# Patient Record
Sex: Female | Born: 1950 | Race: Black or African American | Hispanic: No | Smoking: Never smoker
Health system: Southern US, Community
[De-identification: ages and names within clinical notes are randomized; demographics above are authoritative.]

## PROBLEM LIST (undated history)

## (undated) ENCOUNTER — Emergency Department (HOSPITAL_COMMUNITY): Payer: 59 | Source: Home / Self Care

## (undated) DIAGNOSIS — E213 Hyperparathyroidism, unspecified: Secondary | ICD-10-CM

## (undated) DIAGNOSIS — E785 Hyperlipidemia, unspecified: Secondary | ICD-10-CM

## (undated) DIAGNOSIS — N189 Chronic kidney disease, unspecified: Secondary | ICD-10-CM

## (undated) DIAGNOSIS — I10 Essential (primary) hypertension: Secondary | ICD-10-CM

## (undated) DIAGNOSIS — D649 Anemia, unspecified: Secondary | ICD-10-CM

## (undated) DIAGNOSIS — D472 Monoclonal gammopathy: Secondary | ICD-10-CM

## (undated) HISTORY — PX: CARPAL TUNNEL RELEASE: SHX101

## (undated) HISTORY — DX: Monoclonal gammopathy: D47.2

## (undated) HISTORY — DX: Anemia, unspecified: D64.9

## (undated) HISTORY — DX: Hyperlipidemia, unspecified: E78.5

## (undated) HISTORY — DX: Hyperparathyroidism, unspecified: E21.3

## (undated) HISTORY — PX: TOTAL ABDOMINAL HYSTERECTOMY W/ BILATERAL SALPINGOOPHORECTOMY: SHX83

## (undated) HISTORY — DX: Chronic kidney disease, unspecified: N18.9

---

## 1998-07-08 ENCOUNTER — Emergency Department (HOSPITAL_COMMUNITY): Admission: EM | Admit: 1998-07-08 | Discharge: 1998-07-08 | Payer: Self-pay | Admitting: Emergency Medicine

## 1998-10-02 ENCOUNTER — Encounter: Payer: Self-pay | Admitting: *Deleted

## 1998-10-04 ENCOUNTER — Inpatient Hospital Stay (HOSPITAL_COMMUNITY): Admission: RE | Admit: 1998-10-04 | Discharge: 1998-10-06 | Payer: Self-pay | Admitting: *Deleted

## 1999-07-16 ENCOUNTER — Encounter: Admission: RE | Admit: 1999-07-16 | Discharge: 1999-10-14 | Payer: Self-pay | Admitting: Family Medicine

## 1999-12-02 ENCOUNTER — Ambulatory Visit (HOSPITAL_COMMUNITY): Admission: RE | Admit: 1999-12-02 | Discharge: 1999-12-02 | Payer: Self-pay | Admitting: Family Medicine

## 1999-12-02 ENCOUNTER — Encounter: Payer: Self-pay | Admitting: Family Medicine

## 2000-12-24 ENCOUNTER — Encounter: Admission: RE | Admit: 2000-12-24 | Discharge: 2000-12-24 | Payer: Self-pay | Admitting: Internal Medicine

## 2000-12-24 ENCOUNTER — Encounter: Payer: Self-pay | Admitting: Internal Medicine

## 2001-05-04 ENCOUNTER — Ambulatory Visit (HOSPITAL_COMMUNITY): Admission: RE | Admit: 2001-05-04 | Discharge: 2001-05-04 | Payer: Self-pay | Admitting: Internal Medicine

## 2001-05-04 ENCOUNTER — Encounter: Payer: Self-pay | Admitting: Internal Medicine

## 2003-11-02 ENCOUNTER — Ambulatory Visit (HOSPITAL_COMMUNITY): Admission: RE | Admit: 2003-11-02 | Discharge: 2003-11-02 | Payer: Self-pay | Admitting: Internal Medicine

## 2004-04-06 ENCOUNTER — Emergency Department (HOSPITAL_COMMUNITY): Admission: EM | Admit: 2004-04-06 | Discharge: 2004-04-07 | Payer: Self-pay | Admitting: Emergency Medicine

## 2004-04-08 ENCOUNTER — Emergency Department (HOSPITAL_COMMUNITY): Admission: EM | Admit: 2004-04-08 | Discharge: 2004-04-08 | Payer: Self-pay | Admitting: Emergency Medicine

## 2004-09-05 ENCOUNTER — Ambulatory Visit (HOSPITAL_BASED_OUTPATIENT_CLINIC_OR_DEPARTMENT_OTHER): Admission: RE | Admit: 2004-09-05 | Discharge: 2004-09-05 | Payer: Self-pay | Admitting: Cardiology

## 2004-09-08 ENCOUNTER — Ambulatory Visit: Payer: Self-pay | Admitting: Internal Medicine

## 2004-11-21 ENCOUNTER — Ambulatory Visit (HOSPITAL_COMMUNITY): Admission: RE | Admit: 2004-11-21 | Discharge: 2004-11-21 | Payer: Self-pay | Admitting: Cardiology

## 2005-08-25 ENCOUNTER — Encounter: Admission: RE | Admit: 2005-08-25 | Discharge: 2005-08-25 | Payer: Self-pay | Admitting: Neurosurgery

## 2011-10-15 ENCOUNTER — Other Ambulatory Visit (HOSPITAL_COMMUNITY): Payer: Self-pay | Admitting: Cardiology

## 2011-10-15 DIAGNOSIS — N189 Chronic kidney disease, unspecified: Secondary | ICD-10-CM

## 2011-10-15 DIAGNOSIS — I10 Essential (primary) hypertension: Secondary | ICD-10-CM

## 2011-10-21 ENCOUNTER — Ambulatory Visit (HOSPITAL_COMMUNITY)
Admission: RE | Admit: 2011-10-21 | Discharge: 2011-10-21 | Disposition: A | Discharge status: Home / Self Care | Payer: 59 | Source: Ambulatory Visit | Attending: Cardiology | Admitting: Cardiology

## 2011-10-21 DIAGNOSIS — N189 Chronic kidney disease, unspecified: Secondary | ICD-10-CM | POA: Insufficient documentation

## 2012-02-29 ENCOUNTER — Emergency Department (HOSPITAL_BASED_OUTPATIENT_CLINIC_OR_DEPARTMENT_OTHER)
Admission: EM | Admit: 2012-02-29 | Discharge: 2012-02-29 | Disposition: A | Discharge status: Home / Self Care | Payer: 59 | Attending: Emergency Medicine | Admitting: Emergency Medicine

## 2012-02-29 ENCOUNTER — Encounter (HOSPITAL_BASED_OUTPATIENT_CLINIC_OR_DEPARTMENT_OTHER): Payer: Self-pay | Admitting: *Deleted

## 2012-02-29 ENCOUNTER — Emergency Department (HOSPITAL_BASED_OUTPATIENT_CLINIC_OR_DEPARTMENT_OTHER): Payer: 59

## 2012-02-29 DIAGNOSIS — M778 Other enthesopathies, not elsewhere classified: Secondary | ICD-10-CM

## 2012-02-29 DIAGNOSIS — IMO0001 Reserved for inherently not codable concepts without codable children: Secondary | ICD-10-CM | POA: Insufficient documentation

## 2012-02-29 DIAGNOSIS — Z79899 Other long term (current) drug therapy: Secondary | ICD-10-CM | POA: Insufficient documentation

## 2012-02-29 DIAGNOSIS — I10 Essential (primary) hypertension: Secondary | ICD-10-CM | POA: Insufficient documentation

## 2012-02-29 DIAGNOSIS — M719 Bursopathy, unspecified: Secondary | ICD-10-CM | POA: Insufficient documentation

## 2012-02-29 DIAGNOSIS — Y929 Unspecified place or not applicable: Secondary | ICD-10-CM | POA: Insufficient documentation

## 2012-02-29 DIAGNOSIS — Y939 Activity, unspecified: Secondary | ICD-10-CM | POA: Insufficient documentation

## 2012-02-29 DIAGNOSIS — M67919 Unspecified disorder of synovium and tendon, unspecified shoulder: Secondary | ICD-10-CM | POA: Insufficient documentation

## 2012-02-29 DIAGNOSIS — X58XXXA Exposure to other specified factors, initial encounter: Secondary | ICD-10-CM | POA: Insufficient documentation

## 2012-02-29 HISTORY — DX: Essential (primary) hypertension: I10

## 2012-02-29 MED ORDER — HYDROCODONE-ACETAMINOPHEN 5-325 MG PO TABS
2.0000 | ORAL_TABLET | Freq: Once | ORAL | Status: AC
  Administered 2012-02-29: 2 via ORAL
  Filled 2012-02-29: qty 2

## 2012-02-29 MED ORDER — HYDROCODONE-ACETAMINOPHEN 5-325 MG PO TABS: 2.0000 | ORAL_TABLET | ORAL | Status: DC | PRN

## 2012-02-29 NOTE — ED Provider Notes (Signed)
History     CSN: [id number]  Arrival date & time 02/29/12  1425   First MD Initiated Contact with Patient 02/29/12 1612      Chief Complaint  Patient presents with  . Arm Pain    (Consider location/radiation/quality/duration/timing/severity/associated sxs/prior treatment) Patient is a 62 y.o. female presenting with shoulder injury. The history is provided by the patient. No language interpreter was used.  Shoulder Injury This is a new problem. The current episode started today. The problem occurs constantly. The problem has been gradually worsening. Associated symptoms include joint swelling and myalgias. Nothing aggravates the symptoms. She has tried nothing for the symptoms. The treatment provided moderate relief.   Pt complains of pain in her right shoulder. Pt did not take her blood pressure medication today Past Medical History  Diagnosis Date  . Hypertension     Past Surgical History  Procedure Laterality Date  . Cesarean section    . Carpal tunnel release    . Abdominal hysterectomy      No family history on file.  History  Substance Use Topics  . Smoking status: Never Smoker   . Smokeless tobacco: Not on file  . Alcohol Use: No    OB History   Grav Para Term Preterm Abortions TAB SAB Ect Mult Living                  Review of Systems  Musculoskeletal: Positive for myalgias and joint swelling.  All other systems reviewed and are negative.    Allergies  Review of patient's allergies indicates no known allergies.  Home Medications   Current Outpatient Rx  Name  Route  Sig  Dispense  Refill  . amLODipine (NORVASC) 10 MG tablet   Oral   Take 10 mg by mouth daily.         . nebivolol (BYSTOLIC) 10 MG tablet   Oral   Take 10 mg by mouth daily.         . valsartan (DIOVAN) 160 MG tablet   Oral   Take 160 mg by mouth daily.           BP 210/119  Pulse 67  Temp(Src) 98.2 F (36.8 C) (Oral)  Resp 16  Ht 5\' 2"  (1.575 m)  Wt 146 lb  (66.225 kg)  BMI 26.7 kg/m2  SpO2 99%  Physical Exam  Nursing note and vitals reviewed. Constitutional: She appears well-developed and well-nourished.  HENT:  Head: Normocephalic.  Right Ear: External ear normal.  Eyes: Pupils are equal, round, and reactive to light.  Neck: Normal range of motion.  Cardiovascular: Normal rate.   Pulmonary/Chest: Effort normal.  Abdominal: Soft.  Musculoskeletal: Normal range of motion.  Neurological: She is alert.  Skin: Skin is warm.  Psychiatric: She has a normal mood and affect.    ED Course  Procedures (including critical care time)  Labs Reviewed - No data to display Dg Shoulder Right  02/29/2012  *RADIOLOGY REPORT*  Clinical Data: Right shoulder pain.  No known injury.  RIGHT SHOULDER - 2+ VIEW  Comparison: None  Findings: No acute bony abnormality.  Specifically, no fracture, subluxation, or dislocation.  Soft tissues are intact. Joint spaces are maintained.  Normal bone mineralization.  Mild interstitial prominence and peribronchial thickening within the visualized right upper lung.  IMPRESSION: No acute bony abnormality.   Original Report Authenticated By: Charlett Nose, M.D.      1. Tendonitis of shoulder, right   2. Hypertension  MDM  Pt did not take her morning medication for blood pressure.   I asked her to take here.   Pt advised to continue her blood pressure medications.   She is advised to see Dr. Shana Chute for recheck this week.   Pt referred to dr. Dion Saucier for shoulder.   I suspect a rotator cuff tendonitis  Pt given hydrocodone for pain        Lonia Skinner [place], [place] 02/29/12 1821

## 2012-02-29 NOTE — ED Provider Notes (Signed)
Medical screening examination/treatment/procedure(s) were performed by non-physician practitioner and as supervising physician I was immediately available for consultation/collaboration.  Ethelda Chick, MD 02/29/12 [phone]

## 2012-02-29 NOTE — ED Notes (Addendum)
Patient states that she is having right shoulder/ arm pain. Pain been present for 2 weeks., hurts with movement and lifting. Was recently placed on norvasc and told her cardiologist that it causes her pain. Dr aware and told patient that she would need to adjust to the medication. She states that she has been trying to reach the office to have her medicine changed. Has not taken any of her medications today. Does not have any symptoms with her BP and states that it always runs high.

## 2012-04-22 ENCOUNTER — Other Ambulatory Visit (HOSPITAL_COMMUNITY): Payer: Self-pay | Admitting: Orthopedic Surgery

## 2012-04-22 DIAGNOSIS — M25511 Pain in right shoulder: Secondary | ICD-10-CM

## 2012-04-23 ENCOUNTER — Ambulatory Visit (HOSPITAL_COMMUNITY)
Admission: RE | Admit: 2012-04-23 | Discharge: 2012-04-23 | Disposition: A | Discharge status: Home / Self Care | Payer: 59 | Source: Ambulatory Visit | Attending: Orthopedic Surgery | Admitting: Orthopedic Surgery

## 2012-04-23 DIAGNOSIS — M25519 Pain in unspecified shoulder: Secondary | ICD-10-CM | POA: Insufficient documentation

## 2012-04-23 DIAGNOSIS — X58XXXA Exposure to other specified factors, initial encounter: Secondary | ICD-10-CM | POA: Insufficient documentation

## 2012-04-23 DIAGNOSIS — IMO0002 Reserved for concepts with insufficient information to code with codable children: Secondary | ICD-10-CM | POA: Insufficient documentation

## 2012-04-23 DIAGNOSIS — M25419 Effusion, unspecified shoulder: Secondary | ICD-10-CM | POA: Insufficient documentation

## 2012-04-23 DIAGNOSIS — M6789 Other specified disorders of synovium and tendon, multiple sites: Secondary | ICD-10-CM | POA: Insufficient documentation

## 2012-04-23 DIAGNOSIS — M25511 Pain in right shoulder: Secondary | ICD-10-CM

## 2012-04-23 DIAGNOSIS — M75100 Unspecified rotator cuff tear or rupture of unspecified shoulder, not specified as traumatic: Secondary | ICD-10-CM | POA: Insufficient documentation

## 2012-05-21 ENCOUNTER — Ambulatory Visit: Payer: 59 | Admitting: Physical Therapy

## 2013-01-18 ENCOUNTER — Emergency Department (HOSPITAL_BASED_OUTPATIENT_CLINIC_OR_DEPARTMENT_OTHER)
Admission: EM | Admit: 2013-01-18 | Discharge: 2013-01-19 | Disposition: A | Discharge status: Home / Self Care | Payer: 59 | Attending: Emergency Medicine | Admitting: Emergency Medicine

## 2013-01-18 ENCOUNTER — Encounter (HOSPITAL_BASED_OUTPATIENT_CLINIC_OR_DEPARTMENT_OTHER): Payer: Self-pay | Admitting: Emergency Medicine

## 2013-01-18 DIAGNOSIS — I10 Essential (primary) hypertension: Secondary | ICD-10-CM | POA: Insufficient documentation

## 2013-01-18 DIAGNOSIS — R209 Unspecified disturbances of skin sensation: Secondary | ICD-10-CM | POA: Insufficient documentation

## 2013-01-18 DIAGNOSIS — Z79899 Other long term (current) drug therapy: Secondary | ICD-10-CM | POA: Insufficient documentation

## 2013-01-18 DIAGNOSIS — S79919A Unspecified injury of unspecified hip, initial encounter: Secondary | ICD-10-CM | POA: Insufficient documentation

## 2013-01-18 DIAGNOSIS — T07XXXA Unspecified multiple injuries, initial encounter: Secondary | ICD-10-CM | POA: Insufficient documentation

## 2013-01-18 DIAGNOSIS — S79929A Unspecified injury of unspecified thigh, initial encounter: Secondary | ICD-10-CM

## 2013-01-18 DIAGNOSIS — Y9289 Other specified places as the place of occurrence of the external cause: Secondary | ICD-10-CM | POA: Insufficient documentation

## 2013-01-18 DIAGNOSIS — I160 Hypertensive urgency: Secondary | ICD-10-CM

## 2013-01-18 DIAGNOSIS — W1789XA Other fall from one level to another, initial encounter: Secondary | ICD-10-CM | POA: Insufficient documentation

## 2013-01-18 DIAGNOSIS — Y9389 Activity, other specified: Secondary | ICD-10-CM | POA: Insufficient documentation

## 2013-01-18 DIAGNOSIS — S8990XA Unspecified injury of unspecified lower leg, initial encounter: Secondary | ICD-10-CM | POA: Insufficient documentation

## 2013-01-18 DIAGNOSIS — W19XXXA Unspecified fall, initial encounter: Secondary | ICD-10-CM

## 2013-01-18 DIAGNOSIS — S99929A Unspecified injury of unspecified foot, initial encounter: Secondary | ICD-10-CM

## 2013-01-18 DIAGNOSIS — R259 Unspecified abnormal involuntary movements: Secondary | ICD-10-CM | POA: Insufficient documentation

## 2013-01-18 DIAGNOSIS — S99919A Unspecified injury of unspecified ankle, initial encounter: Secondary | ICD-10-CM
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
## 2013-01-18 NOTE — ED Notes (Signed)
BP noted. Pt states she did not take her BP med today.

## 2013-01-19 LAB — CBC WITH DIFFERENTIAL/PLATELET
BASOS ABS: 0 10*3/uL (ref 0.0–0.1)
BASOS PCT: 1 % (ref 0–1)
Eosinophils Absolute: 0.3 10*3/uL (ref 0.0–0.7)
Eosinophils Relative: 3 % (ref 0–5)
HCT: 32.5 % — ABNORMAL LOW (ref 36.0–46.0)
Hemoglobin: 10.8 g/dL — ABNORMAL LOW (ref 12.0–15.0)
LYMPHS PCT: 18 % (ref 12–46)
Lymphs Abs: 1.5 10*3/uL (ref 0.7–4.0)
MCH: 26.5 pg (ref 26.0–34.0)
MCHC: 33.2 g/dL (ref 30.0–36.0)
MCV: 79.7 fL (ref 78.0–100.0)
MONO ABS: 0.7 10*3/uL (ref 0.1–1.0)
Monocytes Relative: 8 % (ref 3–12)
NEUTROS ABS: 6 10*3/uL (ref 1.7–7.7)
NEUTROS PCT: 71 % (ref 43–77)
PLATELETS: 202 10*3/uL (ref 150–400)
RBC: 4.08 MIL/uL (ref 3.87–5.11)
RDW: 15.1 % (ref 11.5–15.5)
WBC: 8.5 10*3/uL (ref 4.0–10.5)

## 2013-01-19 LAB — COMPREHENSIVE METABOLIC PANEL
ALBUMIN: 4.5 g/dL (ref 3.5–5.2)
ALT: 19 U/L (ref 0–35)
AST: 20 U/L (ref 0–37)
Alkaline Phosphatase: 145 U/L — ABNORMAL HIGH (ref 39–117)
BILIRUBIN TOTAL: 0.4 mg/dL (ref 0.3–1.2)
BUN: 36 mg/dL — ABNORMAL HIGH (ref 6–23)
CALCIUM: 9.5 mg/dL (ref 8.4–10.5)
CHLORIDE: 102 meq/L (ref 96–112)
CO2: 27 meq/L (ref 19–32)
CREATININE: 2.3 mg/dL — AB (ref 0.50–1.10)
GFR calc Af Amer: 25 mL/min — ABNORMAL LOW (ref >90)
GFR, EST NON AFRICAN AMERICAN: 22 mL/min — AB (ref >90)
Glucose, Bld: 106 mg/dL — ABNORMAL HIGH (ref 70–99)
Potassium: 3.4 mEq/L — ABNORMAL LOW (ref 3.7–5.3)
SODIUM: 143 meq/L (ref 137–147)
Total Protein: 8.2 g/dL (ref 6.0–8.3)

## 2013-01-19 MED ORDER — CLONIDINE HCL 0.1 MG PO TABS
0.2000 mg | ORAL_TABLET | Freq: Once | ORAL | Status: AC
  Administered 2013-01-19: 0.2 mg via ORAL
  Filled 2013-01-19: qty 2

## 2013-01-19 MED ORDER — NEBIVOLOL HCL 10 MG PO TABS: 10.0000 mg | ORAL_TABLET | Freq: Every day | ORAL | Status: DC

## 2013-01-19 NOTE — Discharge Instructions (Signed)
By systolic 10 mg daily. Keep a record of your blood pressures at home so that you may present these to your primary Dr. for evaluation at your next followup.  Return to the emergency department if you develop chest pain, severe headache, difficulty breathing, or other new or concerning symptoms.   Arterial Hypertension Arterial hypertension (high blood pressure) is a condition of elevated pressure in your blood vessels. Hypertension over a long period of time is a risk factor for strokes, heart attacks, and heart failure. It is also the leading cause of kidney (renal) failure.  CAUSES   In Adults -- Over 90% of all hypertension has no known cause. This is called essential or primary hypertension. In the other 10% of people with hypertension, the increase in blood pressure is caused by another disorder. This is called secondary hypertension. Important causes of secondary hypertension are:  Heavy alcohol use.  Obstructive sleep apnea.  Hyperaldosterosim (Conn's syndrome).  Steroid use.  Chronic kidney failure.  Hyperparathyroidism.  Medications.  Renal artery stenosis.  Pheochromocytoma.  Cushing's disease.  Coarctation of the aorta.  Scleroderma renal crisis.  Licorice (in excessive amounts).  Drugs (cocaine, methamphetamine). Your caregiver can explain any items above that apply to you.  In Children -- Secondary hypertension is more common and should always be considered.  Pregnancy -- Few women of childbearing age have high blood pressure. However, up to 10% of them develop hypertension of pregnancy. Generally, this will not harm the woman. It may be a sign of 3 complications of pregnancy: preeclampsia, HELLP syndrome, and eclampsia. Follow up and control with medication is necessary. SYMPTOMS   This condition normally does not produce any noticeable symptoms. It is usually found during a routine exam.  Malignant hypertension is a late problem of high blood pressure.  It may have the following symptoms:  Headaches.  Blurred vision.  End-organ damage (this means your kidneys, heart, lungs, and other organs are being damaged).  Stressful situations can increase the blood pressure. If a person with normal blood pressure has their blood pressure go up while being seen by their caregiver, this is often termed "white coat hypertension." Its importance is not known. It may be related with eventually developing hypertension or complications of hypertension.  Hypertension is often confused with mental tension, stress, and anxiety. DIAGNOSIS  The diagnosis is made by 3 separate blood pressure measurements. They are taken at least 1 week apart from each other. If there is organ damage from hypertension, the diagnosis may be made without repeat measurements. Hypertension is usually identified by having blood pressure readings:  Above 140/90 mmHg measured in both arms, at 3 separate times, over a couple weeks.  Over 130/80 mmHg should be considered a risk factor and may require treatment in patients with diabetes. Blood pressure readings over 120/80 mmHg are called "pre-hypertension" even in non-diabetic patients. To get a true blood pressure measurement, use the following guidelines. Be aware of the factors that can alter blood pressure readings.  Take measurements at least 1 hour after caffeine.  Take measurements 30 minutes after smoking and without any stress. This is another reason to quit smoking  it raises your blood pressure.  Use a proper cuff size. Ask your caregiver if you are not sure about your cuff size.  Most home blood pressure cuffs are automatic. They will measure systolic and diastolic pressures. The systolic pressure is the pressure reading at the start of sounds. Diastolic pressure is the pressure at which the sounds  disappear. If you are elderly, measure pressures in multiple postures. Try sitting, lying or standing.  Sit at rest for a  minimum of 5 minutes before taking measurements.  You should not be on any medications like decongestants. These are found in many cold medications.  Record your blood pressure readings and review them with your caregiver. If you have hypertension:  Your caregiver may do tests to be sure you do not have secondary hypertension (see "causes" above).  Your caregiver may also look for signs of metabolic syndrome. This is also called Syndrome X or Insulin Resistance Syndrome. You may have this syndrome if you have type 2 diabetes, abdominal obesity, and abnormal blood lipids in addition to hypertension.  Your caregiver will take your medical and family history and perform a physical exam.  Diagnostic tests may include blood tests (for glucose, cholesterol, potassium, and kidney function), a urinalysis, or an EKG. Other tests may also be necessary depending on your condition. PREVENTION  There are important lifestyle issues that you can adopt to reduce your chance of developing hypertension:  Maintain a normal weight.  Limit the amount of salt (sodium) in your diet.  Exercise often.  Limit alcohol intake.  Get enough potassium in your diet. Discuss specific advice with your caregiver.  Follow a DASH diet (dietary approaches to stop hypertension). This diet is rich in fruits, vegetables, and low-fat dairy products, and avoids certain fats. PROGNOSIS  Essential hypertension cannot be cured. Lifestyle changes and medical treatment can lower blood pressure and reduce complications. The prognosis of secondary hypertension depends on the underlying cause. Many people whose hypertension is controlled with medicine or lifestyle changes can live a normal, healthy life.  RISKS AND COMPLICATIONS  While high blood pressure alone is not an illness, it often requires treatment due to its short- and long-term effects on many organs. Hypertension increases your risk for:  CVAs or strokes (cerebrovascular  accident).  Heart failure due to chronically high blood pressure (hypertensive cardiomyopathy).  Heart attack (myocardial infarction).  Damage to the retina (hypertensive retinopathy).  Kidney failure (hypertensive nephropathy). Your caregiver can explain list items above that apply to you. Treatment of hypertension can significantly reduce the risk of complications. TREATMENT   For overweight patients, weight loss and regular exercise are recommended. Physical fitness lowers blood pressure.  Mild hypertension is usually treated with diet and exercise. A diet rich in fruits and vegetables, fat-free dairy products, and foods low in fat and salt (sodium) can help lower blood pressure. Decreasing salt intake decreases blood pressure in a 1/3 of people.  Stop smoking if you are a smoker. The steps above are highly effective in reducing blood pressure. While these actions are easy to suggest, they are difficult to achieve. Most patients with moderate or severe hypertension end up requiring medications to bring their blood pressure down to a normal level. There are several classes of medications for treatment. Blood pressure pills (antihypertensives) will lower blood pressure by their different actions. Lowering the blood pressure by 10 mmHg may decrease the risk of complications by as much as 25%. The goal of treatment is effective blood pressure control. This will reduce your risk for complications. Your caregiver will help you determine the best treatment for you according to your lifestyle. What is excellent treatment for one person, may not be for you. HOME CARE INSTRUCTIONS   Do not smoke.  Follow the lifestyle changes outlined in the "Prevention" section.  If you are on medications, follow the directions  carefully. Blood pressure medications must be taken as prescribed. Skipping doses reduces their benefit. It also puts you at risk for problems.  Follow up with your caregiver, as  directed.  If you are asked to monitor your blood pressure at home, follow the guidelines in the "Diagnosis" section above. SEEK MEDICAL CARE IF:   You think you are having medication side effects.  You have recurrent headaches or lightheadedness.  You have swelling in your ankles.  You have trouble with your vision. SEEK IMMEDIATE MEDICAL CARE IF:   You have sudden onset of chest pain or pressure, difficulty breathing, or other symptoms of a heart attack.  You have a severe headache.  You have symptoms of a stroke (such as sudden weakness, difficulty speaking, difficulty walking). MAKE SURE YOU:   Understand these instructions.  Will watch your condition.  Will get help right away if you are not doing well or get worse. Document Released: 12/30/2004 Document Revised: 03/24/2011 Document Reviewed: 07/30/2006 [facility] Patient Information 2014 [place].
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
## 2013-02-16 ENCOUNTER — Encounter: Payer: Self-pay | Admitting: Physician Assistant

## 2013-02-16 ENCOUNTER — Ambulatory Visit (INDEPENDENT_AMBULATORY_CARE_PROVIDER_SITE_OTHER): Payer: 59 | Admitting: Physician Assistant

## 2013-02-16 ENCOUNTER — Other Ambulatory Visit (INDEPENDENT_AMBULATORY_CARE_PROVIDER_SITE_OTHER): Payer: 59

## 2013-02-16 VITALS — BP 168/98 | HR 78 | Temp 97.8°F | Resp 16 | Ht 62.0 in | Wt 148.2 lb

## 2013-02-16 DIAGNOSIS — R739 Hyperglycemia, unspecified: Secondary | ICD-10-CM

## 2013-02-16 DIAGNOSIS — Z1231 Encounter for screening mammogram for malignant neoplasm of breast: Secondary | ICD-10-CM

## 2013-02-16 DIAGNOSIS — I517 Cardiomegaly: Secondary | ICD-10-CM

## 2013-02-16 DIAGNOSIS — R0602 Shortness of breath: Secondary | ICD-10-CM

## 2013-02-16 DIAGNOSIS — I10 Essential (primary) hypertension: Secondary | ICD-10-CM

## 2013-02-16 DIAGNOSIS — R7309 Other abnormal glucose: Secondary | ICD-10-CM

## 2013-02-16 DIAGNOSIS — Z0000 Encounter for general adult medical examination without abnormal findings: Secondary | ICD-10-CM

## 2013-02-16 LAB — URINALYSIS, ROUTINE W REFLEX MICROSCOPIC
BILIRUBIN URINE: NEGATIVE
HGB URINE DIPSTICK: NEGATIVE
Ketones, ur: NEGATIVE
LEUKOCYTES UA: NEGATIVE
NITRITE: NEGATIVE
RBC / HPF: NONE SEEN (ref 0–?)
Specific Gravity, Urine: 1.02 (ref 1.000–1.030)
Total Protein, Urine: 30 — AB
Urine Glucose: NEGATIVE
Urobilinogen, UA: 0.2 (ref 0.0–1.0)
pH: 7 (ref 5.0–8.0)

## 2013-02-16 LAB — LIPID PANEL
CHOL/HDL RATIO: 5
Cholesterol: 189 mg/dL (ref 0–200)
HDL: 40.8 mg/dL (ref >39.00)
LDL Cholesterol: 127 mg/dL — ABNORMAL HIGH (ref 0–99)
Triglycerides: 104 mg/dL (ref 0.0–149.0)
VLDL: 20.8 mg/dL (ref 0.0–40.0)

## 2013-02-16 LAB — HEPATIC FUNCTION PANEL
ALK PHOS: 124 U/L — AB (ref 39–117)
ALT: 28 U/L (ref 0–35)
AST: 22 U/L (ref 0–37)
Albumin: 4.2 g/dL (ref 3.5–5.2)
BILIRUBIN DIRECT: 0 mg/dL (ref 0.0–0.3)
TOTAL PROTEIN: 7.6 g/dL (ref 6.0–8.3)
Total Bilirubin: 0.8 mg/dL (ref 0.3–1.2)

## 2013-02-16 LAB — CBC WITH DIFFERENTIAL/PLATELET
BASOS ABS: 0.1 10*3/uL (ref 0.0–0.1)
BASOS PCT: 0.8 % (ref 0.0–3.0)
EOS ABS: 0.3 10*3/uL (ref 0.0–0.7)
Eosinophils Relative: 4.9 % (ref 0.0–5.0)
HCT: 35.3 % — ABNORMAL LOW (ref 36.0–46.0)
HEMOGLOBIN: 11.4 g/dL — AB (ref 12.0–15.0)
Lymphocytes Relative: 21.5 % (ref 12.0–46.0)
Lymphs Abs: 1.4 10*3/uL (ref 0.7–4.0)
MCHC: 32.4 g/dL (ref 30.0–36.0)
MCV: 81.8 fl (ref 78.0–100.0)
MONO ABS: 0.4 10*3/uL (ref 0.1–1.0)
Monocytes Relative: 6.6 % (ref 3.0–12.0)
NEUTROS ABS: 4.3 10*3/uL (ref 1.4–7.7)
Neutrophils Relative %: 66.2 % (ref 43.0–77.0)
Platelets: 262 10*3/uL (ref 150.0–400.0)
RBC: 4.32 Mil/uL (ref 3.87–5.11)
RDW: 15.7 % — AB (ref 11.5–14.6)
WBC: 6.5 10*3/uL (ref 4.5–10.5)

## 2013-02-16 LAB — BASIC METABOLIC PANEL
BUN: 29 mg/dL — AB (ref 6–23)
CO2: 28 mEq/L (ref 19–32)
CREATININE: 2.2 mg/dL — AB (ref 0.4–1.2)
Calcium: 9 mg/dL (ref 8.4–10.5)
Chloride: 103 mEq/L (ref 96–112)
GFR: 28.58 mL/min — AB (ref >60.00)
Glucose, Bld: 86 mg/dL (ref 70–99)
POTASSIUM: 3.8 meq/L (ref 3.5–5.1)
Sodium: 139 mEq/L (ref 135–145)

## 2013-02-16 LAB — HEMOGLOBIN A1C: Hgb A1c MFr Bld: 5 % (ref 4.6–6.5)

## 2013-02-16 LAB — BRAIN NATRIURETIC PEPTIDE: Pro B Natriuretic peptide (BNP): 745 pg/mL — ABNORMAL HIGH (ref 0.0–100.0)

## 2013-02-16 NOTE — Progress Notes (Signed)
Subjective:    Patient ID: Briana Brock, female    DOB: 1950-08-18, 63 y.o.   MRN: [id number]  HPI Comments: Patient is a 63 year old female who presents to the clinic to establish care. States no history of chronic medical conditions until diagnosed with hypertension this past January. Reports was seen in urgent care and diagnosed with hypertension, placed on Bystolic. Reports she has taken it daily as directed. States she has had a coworker monitor her blood pressure which has been high ever since. Unable to provide specific value. Patient reports concern for her elevated blood pressure stating her father died suddenly with heart problems however, was never diagnosed with any chronic disease. Reports in the last week she has noted a new shortness of breath. States it is not constant and not at rest. Denies chest pain/palpitations, visual change/disturbances, extremity swelling, change in bowel/bladder habits, dizzyness/lightheaded, or other concerns.    Review of Systems  Constitutional: Negative for fever, chills, appetite change and fatigue.  Eyes: Negative for pain and visual disturbance.  Respiratory: Positive for shortness of breath (within last week). Negative for cough, chest tightness and wheezing.   Cardiovascular: Negative for chest pain, palpitations and leg swelling.  Gastrointestinal: Negative for nausea, vomiting and abdominal pain.  Neurological: Positive for numbness (in left fingertips, relieved with movement of hand). Negative for dizziness, weakness, light-headedness and headaches.  All other systems reviewed and are negative.   Past Medical History  Diagnosis Date  . Hypertension    History reviewed. No pertinent family history. History   Social History  . Marital Status: Divorced    Spouse Name: N/A    Number of Children: N/A  . Years of Education: N/A   Social History Main Topics  . Smoking status: Never Smoker   . Smokeless tobacco: None  . Alcohol Use:  No  . Drug Use: None  . Sexual Activity: None   Other Topics Concern  . None   Social History Narrative  . None   Past Surgical History  Procedure Laterality Date  . Cesarean section    . Carpal tunnel release    . Abdominal hysterectomy         Objective:   Physical Exam  Vitals reviewed. Constitutional: She is oriented to person, place, and time. She appears well-developed and well-nourished. No distress.  HENT:  Head: Normocephalic and atraumatic.  Right Ear: Hearing, tympanic membrane, external ear and ear canal normal.  Left Ear: Hearing, tympanic membrane, external ear and ear canal normal.  Nose: Nose normal.  Mouth/Throat: Uvula is midline, oropharynx is clear and moist and mucous membranes are normal.  Eyes: Conjunctivae and EOM are normal. Pupils are equal, round, and reactive to light. No scleral icterus.  Neck: Trachea normal and normal range of motion. Carotid bruit is not present.  Cardiovascular: Normal rate, regular rhythm, S1 normal, S2 normal and normal heart sounds.  Exam reveals no gallop and no friction rub.   No murmur heard. Pulses:      Radial pulses are 2+ on the right side, and 2+ on the left side.       Posterior tibial pulses are 2+ on the right side, and 2+ on the left side.  Pulmonary/Chest: Effort normal and breath sounds normal. She has no wheezes. She has no rhonchi. She has no rales.  Abdominal: Soft. Normal appearance and bowel sounds are normal. She exhibits no distension. There is no tenderness.  Genitourinary:  Deferred to GYN  Musculoskeletal: Normal range  of motion.  FROM U/LE bilateral  Lymphadenopathy:    She has no cervical adenopathy.       Right: No supraclavicular adenopathy present.       Left: No supraclavicular adenopathy present.  Neurological: She is alert and oriented to person, place, and time. She has normal strength. She displays a negative Romberg sign.  Skin: Skin is warm and dry. She is not diaphoretic.    Psychiatric: She has a normal mood and affect. Her speech is normal and behavior is normal.    ECG: today Sinus rhythm, left atrial enlargement Rate: 63     Assessment & Plan:    CPX/v70.0 - Patient has been counseled on age-appropriate routine health concerns for screening and prevention. These are reviewed and up-to-date. Immunizations are up-to-date or declined. Labs ordered and will be reviewed, ECG reviewed.  HTN: Elevated reading while in office today, second reading was 168/120, EKG in office, interpretation in note. Will check cardiac panel, troponin, bnp. Also short of breath will get chest xray. Patient bp elevated, instructed patient to continue with Bystolic as directed and will add on Benicar HCT 40 mg /12.5 mg one tab daily. Samples provided total 42 doses. patient instructed to return to office in one month for evaluation of bp. Instructed to monitor and log bp at home.  Hyperglycemia: Glucose elevated on previous lab will get HgA1C to evaluate for DM.   Patient instructed to go to ED if symptoms worsen.

## 2013-02-16 NOTE — Patient Instructions (Signed)
It was great meeting you today Ms. Briana Brock! Labs have been ordered for you, when you report to lab please be fasting.     Hypertension Hypertension is another name for high blood pressure. High blood pressure may mean that your heart needs to work harder to pump blood. Blood pressure consists of two numbers, which includes a higher number over a lower number (example: 110/72). HOME CARE   Make lifestyle changes as told by your doctor. This may include weight loss and exercise.  Take your blood pressure medicine every day.  Limit how much salt you use.  Stop smoking if you smoke.  Do not use drugs.  Talk to your doctor if you are using decongestants or birth control pills. These medicines might make blood pressure higher.  Females should not drink more than 1 alcoholic drink per day. Males should not drink more than 2 alcoholic drinks per day.  See your doctor as told. GET HELP RIGHT AWAY IF:   You have a blood pressure reading with a top number of 180 or higher.  You get a very bad headache.  You get blurred or changing vision.  You feel confused.  You feel weak, numb, or faint.  You get chest or belly (abdominal) pain.  You throw up (vomit).  You cannot breathe very well. MAKE SURE YOU:   Understand these instructions.  Will watch your condition.  Will get help right away if you are not doing well or get worse. Document Released: 06/18/2007 Document Revised: 03/24/2011 Document Reviewed: 06/18/2007 [facility] Patient Information 2014 [place], [place].  Shortness of Breath Shortness of breath means you have trouble breathing. Shortness of breath needs medical care right away. HOME CARE   Do not smoke.  Avoid being around chemicals or things (paint fumes, dust) that may bother your breathing.  Rest as needed. Slowly begin your normal activities.  Only take medicines as told by your doctor.  Keep all doctor visits as told. GET HELP RIGHT AWAY IF:   Your  shortness of breath gets worse.  You feel lightheaded, pass out (faint), or have a cough that is not helped by medicine.  You cough up blood.  You have pain with breathing.  You have pain in your chest, arms, shoulders, or belly (abdomen).  You have a fever.  You cannot walk up stairs or exercise the way you normally do.  You do not get better in the time expected.  You have a hard time doing normal activities even with rest.  You have problems with your medicines.  You have any new symptoms. MAKE SURE YOU:  Understand these instructions.  Will watch your condition.  Will get help right away if you are not doing well or get worse. Document Released: 06/18/2007 Document Revised: 07/01/2011 Document Reviewed: 03/17/2011 [facility] Patient Information 2014 [place], [place].    Health Maintenance, Female A healthy lifestyle and preventative care can promote health and wellness.  Maintain regular health, dental, and eye exams.  Eat a healthy diet. Foods like vegetables, fruits, whole grains, low-fat dairy products, and lean protein foods contain the nutrients you need without too many calories. Decrease your intake of foods high in solid fats, added sugars, and salt. Get information about a proper diet from your caregiver, if necessary.  Regular physical exercise is one of the most important things you can do for your health. Most adults should get at least 150 minutes of moderate-intensity exercise (any activity that increases your heart rate and causes you  to sweat) each week. In addition, most adults need muscle-strengthening exercises on 2 or more days a week.   Maintain a healthy weight. The body mass index (BMI) is a screening tool to identify possible weight problems. It provides an estimate of body fat based on height and weight. Your caregiver can help determine your BMI, and can help you achieve or maintain a healthy weight. For adults 20 years and older:  A BMI below  18.5 is considered underweight.  A BMI of 18.5 to 24.9 is normal.  A BMI of 25 to 29.9 is considered overweight.  A BMI of 30 and above is considered obese.  Maintain normal blood lipids and cholesterol by exercising and minimizing your intake of saturated fat. Eat a balanced diet with plenty of fruits and vegetables. Blood tests for lipids and cholesterol should begin at age 51 and be repeated every 5 years. If your lipid or cholesterol levels are high, you are over 50, or you are a high risk for heart disease, you may need your cholesterol levels checked more frequently.Ongoing high lipid and cholesterol levels should be treated with medicines if diet and exercise are not effective.  If you smoke, find out from your caregiver how to quit. If you do not use tobacco, do not start.  Lung cancer screening is recommended for adults aged 84 80 years who are at high risk for developing lung cancer because of a history of smoking. Yearly low-dose computed tomography (CT) is recommended for people who have at least a 30-pack-year history of smoking and are a current smoker or have quit within the past 15 years. A pack year of smoking is smoking an average of 1 pack of cigarettes a day for 1 year (for example: 1 pack a day for 30 years or 2 packs a day for 15 years). Yearly screening should continue until the smoker has stopped smoking for at least 15 years. Yearly screening should also be stopped for people who develop a health problem that would prevent them from having lung cancer treatment.  If you are pregnant, do not drink alcohol. If you are breastfeeding, be very cautious about drinking alcohol. If you are not pregnant and choose to drink alcohol, do not exceed 1 drink per day. One drink is considered to be 12 ounces (355 mL) of beer, 5 ounces (148 mL) of wine, or 1.5 ounces (44 mL) of liquor.  Avoid use of street drugs. Do not share needles with anyone. Ask for help if you need support or  instructions about stopping the use of drugs.  High blood pressure causes heart disease and increases the risk of stroke. Blood pressure should be checked at least every 1 to 2 years. Ongoing high blood pressure should be treated with medicines, if weight loss and exercise are not effective.  If you are 33 to 61 years old, ask your caregiver if you should take aspirin to prevent strokes.  Diabetes screening involves taking a blood sample to check your fasting blood sugar level. This should be done once every 3 years, after age 36, if you are within normal weight and without risk factors for diabetes. Testing should be considered at a younger age or be carried out more frequently if you are overweight and have at least 1 risk factor for diabetes.  Breast cancer screening is essential preventative care for women. You should practice "breast self-awareness." This means understanding the normal appearance and feel of your breasts and may include breast self-examination.  Any changes detected, no matter how small, should be reported to a caregiver. Women in their 39s and 30s should have a clinical breast exam (CBE) by a caregiver as part of a regular health exam every 1 to 3 years. After age 65, women should have a CBE every year. Starting at age 46, women should consider having a mammogram (breast X-ray) every year. Women who have a family history of breast cancer should talk to their caregiver about genetic screening. Women at a high risk of breast cancer should talk to their caregiver about having an MRI and a mammogram every year.  Breast cancer gene (BRCA)-related cancer risk assessment is recommended for women who have family members with BRCA-related cancers. BRCA-related cancers include breast, ovarian, tubal, and peritoneal cancers. Having family members with these cancers may be associated with an increased risk for harmful changes (mutations) in the breast cancer genes BRCA1 and BRCA2. Results of the  assessment will determine the need for genetic counseling and BRCA1 and BRCA2 testing.  The Pap test is a screening test for cervical cancer. Women should have a Pap test starting at age 10. Between ages 43 and 82, Pap tests should be repeated every 2 years. Beginning at age 8, you should have a Pap test every 3 years as long as the past 3 Pap tests have been normal. If you had a hysterectomy for a problem that was not cancer or a condition that could lead to cancer, then you no longer need Pap tests. If you are between ages 54 and 88, and you have had normal Pap tests going back 10 years, you no longer need Pap tests. If you have had past treatment for cervical cancer or a condition that could lead to cancer, you need Pap tests and screening for cancer for at least 20 years after your treatment. If Pap tests have been discontinued, risk factors (such as a new sexual partner) need to be reassessed to determine if screening should be resumed. Some women have medical problems that increase the chance of getting cervical cancer. In these cases, your caregiver may recommend more frequent screening and Pap tests.  The human papillomavirus (HPV) test is an additional test that may be used for cervical cancer screening. The HPV test looks for the virus that can cause the cell changes on the cervix. The cells collected during the Pap test can be tested for HPV. The HPV test could be used to screen women aged 70 years and older, and should be used in women of any age who have unclear Pap test results. After the age of 58, women should have HPV testing at the same frequency as a Pap test.  Colorectal cancer can be detected and often prevented. Most routine colorectal cancer screening begins at the age of 12 and continues through age 74. However, your caregiver may recommend screening at an earlier age if you have risk factors for colon cancer. On a yearly basis, your caregiver may provide home test kits to check for  hidden blood in the stool. Use of a small camera at the end of a tube, to directly examine the colon (sigmoidoscopy or colonoscopy), can detect the earliest forms of colorectal cancer. Talk to your caregiver about this at age 70, when routine screening begins. Direct examination of the colon should be repeated every 5 to 10 years through age 3, unless early forms of pre-cancerous polyps or small growths are found.  Hepatitis C blood testing is recommended for all  people born from [birth year] through 1963 and any individual with known risks for hepatitis C.  Practice safe sex. Use condoms and avoid high-risk sexual practices to reduce the spread of sexually transmitted infections (STIs). Sexually active women aged 71 and younger should be checked for Chlamydia, which is a common sexually transmitted infection. Older women with new or multiple partners should also be tested for Chlamydia. Testing for other STIs is recommended if you are sexually active and at increased risk.  Osteoporosis is a disease in which the bones lose minerals and strength with aging. This can result in serious bone fractures. The risk of osteoporosis can be identified using a bone density scan. Women ages 26 and over and women at risk for fractures or osteoporosis should discuss screening with their caregivers. Ask your caregiver whether you should be taking a calcium supplement or vitamin D to reduce the rate of osteoporosis.  Menopause can be associated with physical symptoms and risks. Hormone replacement therapy is available to decrease symptoms and risks. You should talk to your caregiver about whether hormone replacement therapy is right for you.  Use sunscreen. Apply sunscreen liberally and repeatedly throughout the day. You should seek shade when your shadow is shorter than you. Protect yourself by wearing long sleeves, pants, a wide-brimmed hat, and sunglasses year round, whenever you are outdoors.  Notify your caregiver of new  moles or changes in moles, especially if there is a change in shape or color. Also notify your caregiver if a mole is larger than the size of a pencil eraser.  Stay current with your immunizations. Document Released: 07/15/2010 Document Revised: 04/26/2012 Document Reviewed: 07/15/2010 [facility] Patient Information 2014 [place].

## 2013-02-17 ENCOUNTER — Encounter: Payer: Self-pay | Admitting: Physician Assistant

## 2013-02-17 LAB — CARDIAC PANEL
CK MB: 1.7 ng/mL (ref 0.3–4.0)
RELATIVE INDEX: 1.9 calc (ref 0.0–2.5)
Total CK: 89 U/L (ref 7–177)

## 2013-02-17 LAB — TROPONIN I: Troponin I: 0.02 ng/mL (ref <0.06)

## 2013-02-17 LAB — TSH: TSH: 1.97 u[IU]/mL (ref 0.35–5.50)

## 2013-03-04 ENCOUNTER — Ambulatory Visit (INDEPENDENT_AMBULATORY_CARE_PROVIDER_SITE_OTHER): Payer: 59 | Admitting: Cardiology

## 2013-03-04 ENCOUNTER — Encounter: Payer: Self-pay | Admitting: Cardiology

## 2013-03-04 VITALS — BP 206/148 | HR 82 | Ht 62.0 in | Wt 148.0 lb

## 2013-03-04 DIAGNOSIS — R9431 Abnormal electrocardiogram [ECG] [EKG]: Secondary | ICD-10-CM | POA: Insufficient documentation

## 2013-03-04 DIAGNOSIS — R0609 Other forms of dyspnea: Secondary | ICD-10-CM

## 2013-03-04 DIAGNOSIS — I10 Essential (primary) hypertension: Secondary | ICD-10-CM

## 2013-03-04 DIAGNOSIS — R0989 Other specified symptoms and signs involving the circulatory and respiratory systems: Secondary | ICD-10-CM

## 2013-03-04 DIAGNOSIS — R0600 Dyspnea, unspecified: Secondary | ICD-10-CM | POA: Insufficient documentation

## 2013-03-04 MED ORDER — AMLODIPINE BESYLATE 5 MG PO TABS: 5.0000 mg | ORAL_TABLET | Freq: Every day | ORAL | Status: DC

## 2013-03-04 MED ORDER — NEBIVOLOL HCL 10 MG PO TABS: 20.0000 mg | ORAL_TABLET | Freq: Every day | ORAL | Status: DC

## 2013-03-04 NOTE — Patient Instructions (Signed)
Your physician has recommended you make the following change in your medication:   1. Start Amlodipine 5 mg once daily. 2. Increase Bystolic to 20 mg once daily  Your physician has requested that you have an echocardiogram. Echocardiography is a painless test that uses sound waves to create images of your heart. It provides your doctor with information about the size and shape of your heart and how well your heart's chambers and valves are working. This procedure takes approximately one hour. There are no restrictions for this procedure.  Your physician recommends that you schedule a follow-up appointment in: 2 weeks with Dr. Marlou Porch

## 2013-03-04 NOTE — Progress Notes (Signed)
[place]. [address], [address] [place], [place]  [address] Phone: [phone] Fax:  [phone]  Date:  03/04/2013   ID:  Briana, Brock April 05, 1950, MRN [id number]  PCP:  No PCP Per Patient   History of Present Illness: Briana Brock is a 63 y.o. female here for the evaluation of shortness of breath. This is currently in the setting of difficult to control hypertension. Increased dyspnea when showering and walking across street. Has been feeling tired. Left shoulder has bothered her. Left hand was tingling. Works at [employer] with [place]. Charts, some lifting. Had right carpel tunnel surgery. Had a prior office visit on 02/16/13 by Ocie Bob PA-C, her blood pressure was noted to be 168/120. Benicar HCT was added at that time. EKG on 02/16/13 demonstrated sinus rhythm with left ventricular hypertrophy and T-wave inversion in V5.   Wt Readings from Last 3 Encounters:  03/04/13 148 lb (67.132 kg)  02/16/13 148 lb 3.2 oz (67.223 kg)  01/18/13 150 lb (68.04 kg)     Past Medical History  Diagnosis Date  . Hypertension     Past Surgical History  Procedure Laterality Date  . Cesarean section    . Carpal tunnel release    . Total abdominal hysterectomy w/ bilateral salpingoophorectomy      Current Outpatient Prescriptions  Medication Sig Dispense Refill  . nebivolol (BYSTOLIC) 10 MG tablet Take 1 tablet (10 mg total) by mouth daily.  30 tablet  1  . olmesartan-hydrochlorothiazide (BENICAR HCT) 40-12.5 MG per tablet Take 1 tablet by mouth daily.       No current facility-administered medications for this visit.    Allergies:   No Known Allergies  Social History:  The patient  reports that she has never smoked. She does not have any smokeless tobacco history on file. She reports that she does not drink alcohol or use illicit drugs.   Family History  Problem Relation Age of Onset  . Stroke Father   . Heart attack Father   . Hypertension Father   . Diabetes Father      Father had massive MI at age 56's.  ROS:  Please see the history of present illness.   Denies any syncope, bleeding, orthopnea, PND, rash, fevers, chest pain.   All other systems reviewed and negative.   PHYSICAL EXAM: VS:  BP 206/148  Pulse 82  Ht 5\' 2"  (1.575 m)  Wt 148 lb (67.132 kg)  BMI 27.06 kg/m2 Well nourished, well developed, in no acute distress HEENT: normal, [place]/AT, EOMI Neck: no JVD, normal carotid upstroke, no bruit Cardiac:  normal S1, S2; RRR; no murmur Lungs:  clear to auscultation bilaterally, no wheezing, rhonchi or rales Abd: soft, nontender, no hepatomegaly, no bruits Ext: no edema, 2+ distal pulses Skin: warm and dry GU: deferred Neuro: no focal abnormalities noted, AAO x 3  EKG:  02/16/13 demonstrated sinus rhythm with left ventricular hypertrophy and T-wave inversion in V5 03/04/13-sinus rhythm, 82, LVH, biatrial enlargement, no longer demonstrating T wave inversion in V5.  Labs: 02/16/13 - Creatinine 2.2, potassium 3.8, BNP 745, LDL 127, hemoglobin 11.4 Prior medical records reviewed.  ASSESSMENT AND PLAN:  1. Dyspnea-likely secondary to uncontrolled hypertension. I will check an echocardiogram to ensure proper structure and function. Likely has LVH with diastolic dysfunction. If dyspnea is not improved with blood pressure control, we will consider further ischemic evaluation with stress testing. 2. Uncontrolled hypertension-I will increase her Bystolic from 10 mg  to 20 mg a day. I will also add amlodipine 5 mg to her regimen. When I asked her she had ever been on Norvasc before, she remembered having a nagging dry cough at one point. Perhaps this was with an ACE inhibitor. I would like to try the calcium channel blocker to see if this helps. Next up would be to add spironolactone. 3. Abnormal EKG-normalization of T-wave inversion. LVH present. Biatrial enlargement. We will go ahead and check echocardiogram. 4. I will see her back in 2 weeks after initiation of  his medications. Ocie Bob, PA-C will be seeing her in March as well. We need to aggressively decrease her blood pressure. At risk for stroke, heart attack.  Signed, Candee Furbish, MD [facility]  03/04/2013 3:06 PM

## 2013-03-07 ENCOUNTER — Ambulatory Visit (HOSPITAL_COMMUNITY)
Admission: RE | Admit: 2013-03-07 | Discharge: 2013-03-07 | Disposition: A | Discharge status: Home / Self Care | Payer: 59 | Source: Ambulatory Visit | Attending: Cardiology | Admitting: Cardiology

## 2013-03-07 ENCOUNTER — Telehealth: Payer: Self-pay | Admitting: Cardiology

## 2013-03-07 DIAGNOSIS — R9431 Abnormal electrocardiogram [ECG] [EKG]: Secondary | ICD-10-CM | POA: Insufficient documentation

## 2013-03-07 DIAGNOSIS — R0600 Dyspnea, unspecified: Secondary | ICD-10-CM

## 2013-03-07 DIAGNOSIS — I359 Nonrheumatic aortic valve disorder, unspecified: Secondary | ICD-10-CM | POA: Insufficient documentation

## 2013-03-07 DIAGNOSIS — I059 Rheumatic mitral valve disease, unspecified: Secondary | ICD-10-CM | POA: Insufficient documentation

## 2013-03-07 DIAGNOSIS — I079 Rheumatic tricuspid valve disease, unspecified: Secondary | ICD-10-CM | POA: Insufficient documentation

## 2013-03-07 DIAGNOSIS — I10 Essential (primary) hypertension: Secondary | ICD-10-CM | POA: Insufficient documentation

## 2013-03-07 DIAGNOSIS — R0989 Other specified symptoms and signs involving the circulatory and respiratory systems: Secondary | ICD-10-CM | POA: Insufficient documentation

## 2013-03-07 DIAGNOSIS — R0609 Other forms of dyspnea: Secondary | ICD-10-CM | POA: Insufficient documentation

## 2013-03-07 NOTE — Telephone Encounter (Signed)
New Prob    Pt was recently prescribed Amlodipine, and states she has been experiencing abdominal cramping similar to the cramps she gets during her cycle. Pt states she did eat food with her dose. She is concerned and would like to speak to someone.

## 2013-03-09 NOTE — Telephone Encounter (Signed)
Follow up     Pt has questions regarding amlodipine---need to talk to a nurse

## 2013-03-14 ENCOUNTER — Telehealth: Payer: Self-pay | Admitting: *Deleted

## 2013-03-14 DIAGNOSIS — I10 Essential (primary) hypertension: Secondary | ICD-10-CM

## 2013-03-14 MED ORDER — AMLODIPINE BESYLATE 2.5 MG PO TABS: 2.5000 mg | ORAL_TABLET | Freq: Every day | ORAL | Status: DC

## 2013-03-14 MED ORDER — SPIRONOLACTONE 25 MG PO TABS: 25.0000 mg | ORAL_TABLET | Freq: Every day | ORAL | Status: DC

## 2013-03-14 NOTE — Telephone Encounter (Signed)
Patient called in, along with RN in her office, to report that her BP today is: 200/80, 190/90, 210/112, 190/120.  She is having continuous headaches, abdominal cramping, mild dizziness for several days now. She denies N/V. UOP is "okay". Dr. Dorris Carnes advising the following: 1) Decrease dose of Amlodipine from 5 mg to 2.5 mg by mouth daily, 2) Start Aldactone 25 mg by mouth daily, 3) Return for repeat BMET on Friday 03/18/13, and 4) Advising Renal Arterial Scan. Education provided to patient that Aldactone is a diuretic and she should consider taking it in the AM due to the diuresing effect. Patient verbalizes understanding of medication changes, lab appt for Friday.  She verbalized appreciation for responding to call today.

## 2013-03-15 NOTE — Telephone Encounter (Signed)
Please set up renal duplex. Thanks. Dx. HTN uncontrolled.  Briana Brock

## 2013-03-16 ENCOUNTER — Other Ambulatory Visit: Payer: Self-pay

## 2013-03-16 MED ORDER — NEBIVOLOL HCL 10 MG PO TABS: 20.0000 mg | ORAL_TABLET | Freq: Every day | ORAL | Status: DC

## 2013-03-16 NOTE — Telephone Encounter (Signed)
Renal Duplex order sent to [facility] - 03/16/13 @ 1:35 pm/PE.

## 2013-03-18 ENCOUNTER — Ambulatory Visit
Admission: RE | Admit: 2013-03-18 | Discharge: 2013-03-18 | Disposition: A | Discharge status: Home / Self Care | Payer: 59 | Source: Ambulatory Visit | Attending: Physician Assistant | Admitting: Physician Assistant

## 2013-03-18 ENCOUNTER — Other Ambulatory Visit (INDEPENDENT_AMBULATORY_CARE_PROVIDER_SITE_OTHER): Payer: 59

## 2013-03-18 DIAGNOSIS — Z0000 Encounter for general adult medical examination without abnormal findings: Secondary | ICD-10-CM

## 2013-03-18 DIAGNOSIS — I10 Essential (primary) hypertension: Secondary | ICD-10-CM

## 2013-03-18 DIAGNOSIS — I517 Cardiomegaly: Secondary | ICD-10-CM

## 2013-03-18 DIAGNOSIS — R739 Hyperglycemia, unspecified: Secondary | ICD-10-CM

## 2013-03-18 DIAGNOSIS — Z1231 Encounter for screening mammogram for malignant neoplasm of breast: Secondary | ICD-10-CM

## 2013-03-18 LAB — BASIC METABOLIC PANEL
BUN: 42 mg/dL — ABNORMAL HIGH (ref 6–23)
CHLORIDE: 107 meq/L (ref 96–112)
CO2: 25 meq/L (ref 19–32)
Calcium: 9 mg/dL (ref 8.4–10.5)
Creatinine, Ser: 2.3 mg/dL — ABNORMAL HIGH (ref 0.4–1.2)
GFR: 27.58 mL/min — ABNORMAL LOW (ref >60.00)
Glucose, Bld: 73 mg/dL (ref 70–99)
Potassium: 3.9 mEq/L (ref 3.5–5.1)
SODIUM: 140 meq/L (ref 135–145)

## 2013-03-21 ENCOUNTER — Ambulatory Visit (INDEPENDENT_AMBULATORY_CARE_PROVIDER_SITE_OTHER): Payer: 59 | Admitting: Cardiology

## 2013-03-21 ENCOUNTER — Encounter: Payer: Self-pay | Admitting: Cardiology

## 2013-03-21 VITALS — BP 200/100 | HR 66 | Ht 62.0 in | Wt 147.0 lb

## 2013-03-21 DIAGNOSIS — R0989 Other specified symptoms and signs involving the circulatory and respiratory systems: Secondary | ICD-10-CM

## 2013-03-21 DIAGNOSIS — R0609 Other forms of dyspnea: Secondary | ICD-10-CM

## 2013-03-21 DIAGNOSIS — I10 Essential (primary) hypertension: Secondary | ICD-10-CM

## 2013-03-21 DIAGNOSIS — R9431 Abnormal electrocardiogram [ECG] [EKG]: Secondary | ICD-10-CM

## 2013-03-21 DIAGNOSIS — R0600 Dyspnea, unspecified: Secondary | ICD-10-CM

## 2013-03-21 MED ORDER — OLMESARTAN MEDOXOMIL-HCTZ 40-25 MG PO TABS: 1.0000 | ORAL_TABLET | Freq: Every day | ORAL | Status: DC

## 2013-03-21 NOTE — Patient Instructions (Signed)
Your physician has recommended you make the following change in your medication:   1. Start Benicar-HCTZ 40-25mg  once daily.  Your physician recommends that you schedule a follow-up appointment in: 4 weeks with Dr. Marlou Porch.

## 2013-03-21 NOTE — Progress Notes (Signed)
[place]. [address] [place], [place]  [address] Phone: [phone] Fax:  [phone]  Date:  03/21/2013   ID:  Briana Brock, Briana Brock [birth year], MRN [id number]  PCP:  Stacy Gardner, PA-C   History of Present Illness: Briana Brock is a 63 y.o. female here for the followup of uncontrolled hypertension/ shortness of breath. This is currently in the setting of difficult to control hypertension. Increased dyspnea when showering and walking across street. Has been feeling tired. Left shoulder has bothered her. Left hand was tingling. Works at [employer] with [employer]. Charts, some lifting. Had right carpel tunnel surgery. Had a prior office visit on 02/16/13 by Ocie Bob PA-C, her blood pressure was noted to be 168/120. Benicar HCT was added at that time. EKG on 02/16/13 demonstrated sinus rhythm with left ventricular hypertrophy and T-wave inversion in V5.  Currently her blood pressure still is uncontrolled. Some shortness of breath. No headaches, no chest pain, no strokelike symptoms. Stressed somewhat at work.   Wt Readings from Last 3 Encounters:  03/21/13 147 lb (66.679 kg)  03/04/13 148 lb (67.132 kg)  02/16/13 148 lb 3.2 oz (67.223 kg)     Past Medical History  Diagnosis Date  . Hypertension     Past Surgical History  Procedure Laterality Date  . Cesarean section    . Carpal tunnel release    . Total abdominal hysterectomy w/ bilateral salpingoophorectomy      Current Outpatient Prescriptions  Medication Sig Dispense Refill  . amLODipine (NORVASC) 2.5 MG tablet Take 1 tablet (2.5 mg total) by mouth daily.  180 tablet  3  . nebivolol (BYSTOLIC) 10 MG tablet Take 2 tablets (20 mg total) by mouth daily.  60 tablet  6  . spironolactone (ALDACTONE) 25 MG tablet Take 1 tablet (25 mg total) by mouth daily.  90 tablet  3   No current facility-administered medications for this visit.    Allergies:   No Known Allergies  Social History:  The patient  reports that  she has never smoked. She does not have any smokeless tobacco history on file. She reports that she does not drink alcohol or use illicit drugs.   Family History  Problem Relation Age of Onset  . Stroke Father   . Heart attack Father   . Hypertension Father   . Diabetes Father    Father had massive MI at age 25's.  ROS:  Please see the history of present illness.   Denies any syncope, bleeding, orthopnea, PND, rash, fevers, chest pain.   All other systems reviewed and negative.   PHYSICAL EXAM: VS:  BP 200/100  Pulse 66  Ht 5\' 2"  (1.575 m)  Wt 147 lb (66.679 kg)  BMI 26.88 kg/m2 Well nourished, well developed, in no acute distress HEENT: normal, [place]/AT, EOMI Neck: no JVD, normal carotid upstroke, no bruit Cardiac:  normal S1, S2; RRR; no murmur Lungs:  clear to auscultation bilaterally, no wheezing, rhonchi or rales Abd: soft, nontender, no hepatomegaly, no bruits Ext: no edema, 2+ distal pulses Skin: warm and dry GU: deferred Neuro: no focal abnormalities noted, AAO x 3  EKG:  02/16/13 demonstrated sinus rhythm with left ventricular hypertrophy and T-wave inversion in V5 03/04/13-sinus rhythm, 82, LVH, biatrial enlargement, no longer demonstrating T wave inversion in V5.  ECHO: 03/07/13: - Left ventricle: The cavity size was normal. There was severe asymmetric hypertrophy of the septum. Systolic function was normal. The estimated ejection  fraction was in the range of 60% to 65%. Wall motion was normal; there were no regional wall motion abnormalities. Doppler parameters are consistent with abnormal left ventricular relaxation (grade 1 diastolic dysfunction). - Aortic valve: Mild regurgitation. - Mitral valve: Mild regurgitation. - Left atrium: The atrium was mildly dilated. - Right atrium: The atrium was mildly dilated. - Pulmonary arteries: Systolic pressure was mildly increased. 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  Labs: 02/16/13 - Creatinine 2.2, potassium 3.8, BNP 745, LDL 127, hemoglobin 11.4 Prior medical records reviewed.  ASSESSMENT AND PLAN:  1. Dyspnea-likely secondary to uncontrolled hypertension. Echocardiogram demonstrates LVH with diastolic dysfunction. If dyspnea is not improved with blood pressure control, we will consider further ischemic evaluation with stress testing. 2. Uncontrolled hypertension-blood pressure remains elevated. On repeat was 206/110. I will increase her HCTZ portion of Benicar to 25 mg. 40/25 now. Continue with Aldactone 25. Amlodipine 2.5 mg can be increased at a future visit. She was hesitant because of some cramping but she was feeling. Continue with Bystolic 20 mg. She has a renal duplex scheduled to evaluate renal arteries. We could add hydralazine in the future. 3. Abnormal EKG-normalization of T-wave inversion. LVH present. Biatrial enlargement.  4. I will see her back in 4 weeks. Ocie Bob, PA-C has been working on this as well. We need to aggressively decrease her blood pressure. At risk for stroke, heart attack. Consider nephrology referral next point.  Signed, Candee Furbish, MD [facility]  03/21/2013 8:58 AM

## 2013-03-22 ENCOUNTER — Other Ambulatory Visit: Payer: Self-pay | Admitting: Physician Assistant

## 2013-03-22 DIAGNOSIS — R928 Other abnormal and inconclusive findings on diagnostic imaging of breast: Secondary | ICD-10-CM

## 2013-03-25 ENCOUNTER — Ambulatory Visit (HOSPITAL_COMMUNITY): Payer: 59 | Attending: Cardiology | Admitting: *Deleted

## 2013-03-25 DIAGNOSIS — I10 Essential (primary) hypertension: Secondary | ICD-10-CM

## 2013-03-25 NOTE — Telephone Encounter (Signed)
LVM for pt to return call

## 2013-03-25 NOTE — Progress Notes (Signed)
Visceral renal duplex complete

## 2013-03-29 ENCOUNTER — Other Ambulatory Visit: Payer: Self-pay

## 2013-03-29 ENCOUNTER — Other Ambulatory Visit: Payer: Self-pay | Admitting: Physician Assistant

## 2013-03-29 DIAGNOSIS — R928 Other abnormal and inconclusive findings on diagnostic imaging of breast: Secondary | ICD-10-CM

## 2013-04-04 ENCOUNTER — Ambulatory Visit
Admission: RE | Admit: 2013-04-04 | Discharge: 2013-04-04 | Disposition: A | Discharge status: Home / Self Care | Payer: 59 | Source: Ambulatory Visit | Attending: Physician Assistant | Admitting: Physician Assistant

## 2013-04-04 DIAGNOSIS — R928 Other abnormal and inconclusive findings on diagnostic imaging of breast: Secondary | ICD-10-CM

## 2013-04-04 NOTE — Telephone Encounter (Signed)
Left message to call the office, closing note.

## 2013-04-07 ENCOUNTER — Other Ambulatory Visit: Payer: Self-pay | Admitting: *Deleted

## 2013-04-07 MED ORDER — NEBIVOLOL HCL 10 MG PO TABS: 20.0000 mg | ORAL_TABLET | Freq: Every day | ORAL | Status: DC

## 2013-04-13 ENCOUNTER — Encounter: Payer: 59 | Admitting: Obstetrics & Gynecology

## 2013-04-14 ENCOUNTER — Encounter: Payer: 59 | Admitting: Family

## 2013-04-25 ENCOUNTER — Telehealth: Payer: Self-pay | Admitting: *Deleted

## 2013-04-25 NOTE — Telephone Encounter (Signed)
Patient called for samples of bystolic and benicar. Samples left at the front desk for pick up.

## 2013-04-27 ENCOUNTER — Ambulatory Visit: Payer: 59 | Admitting: Cardiology

## 2013-08-22 ENCOUNTER — Telehealth: Payer: Self-pay | Admitting: Cardiology

## 2013-08-22 NOTE — Telephone Encounter (Signed)
NEW MESSAGE  Pt requests a call back to determine if the notes/results were received discussing her eye exams as it pertains to her BP and medication change. Please call back to discuss

## 2013-08-22 NOTE — Telephone Encounter (Signed)
Pt calling wanting to know if we have received any paperwork from her eye Dr about changing her medication for her BP.  Advised I do not recall seeing anything and nothing has been scanned into the computer.  Attempted to have pt to schedule appt for the evaluation of HTN however she refused at this time stating she will call when she returns to town.

## 2013-08-25 ENCOUNTER — Encounter: Payer: Self-pay | Admitting: Cardiology

## 2013-08-25 ENCOUNTER — Ambulatory Visit (INDEPENDENT_AMBULATORY_CARE_PROVIDER_SITE_OTHER): Payer: 59 | Admitting: Cardiology

## 2013-08-25 VITALS — BP 210/120 | HR 90 | Ht 62.0 in | Wt 145.2 lb

## 2013-08-25 DIAGNOSIS — I10 Essential (primary) hypertension: Secondary | ICD-10-CM

## 2013-08-25 MED ORDER — CLONIDINE HCL 0.1 MG PO TABS
0.1000 mg | ORAL_TABLET | Freq: Once | ORAL | Status: AC
  Administered 2013-08-25: 0.1 mg via ORAL

## 2013-08-25 MED ORDER — MINOXIDIL 10 MG PO TABS: 5.0000 mg | ORAL_TABLET | Freq: Every day | ORAL | Status: AC

## 2013-08-25 NOTE — Patient Instructions (Signed)
Please start Minoxidil 10 mg.  Take 1/2 tablet a day (5 mg). Continue all other medications as listed.  Follow up in 1 week with Dr Marlou Porch.

## 2013-08-25 NOTE — Progress Notes (Signed)
[facility] [address], [address] [place], [place]  [address] Phone: [phone] Fax:  [phone]  Date:  08/25/2013   ID:  Briana, Brock [birth year], MRN [id number]  PCP:  Stacy Gardner, PA-C   History of Present Illness: Briana Brock is a 63 y.o. female here for the followup of uncontrolled hypertension/ shortness of breath. This is currently in the setting of difficult to control hypertension. Increased dyspnea when showering and walking across street. Has been feeling tired. Left shoulder has bothered her. Left hand was tingling. Works at [employer] with [employer], some lifting. Had right carpel tunnel surgery. Had a prior office visit on 02/16/13 by Ocie Bob PA-C, her blood pressure was noted to be 168/120. Benicar HCT was added at that time. EKG on 02/16/13 demonstrated sinus rhythm with left ventricular hypertrophy and T-wave inversion in V5.  Currently her blood pressure still is uncontrolled. Some shortness of breath. No headaches, no chest pain, no strokelike symptoms. Stressed somewhat at work.  Eye MD demonstrated retinal blushing/bleeding resulting from HTN.    Wt Readings from Last 3 Encounters:  08/25/13 145 lb 3.2 oz (65.862 kg)  03/21/13 147 lb (66.679 kg)  03/04/13 148 lb (67.132 kg)     Past Medical History  Diagnosis Date  . Hypertension     Past Surgical History  Procedure Laterality Date  . Cesarean section    . Carpal tunnel release    . Total abdominal hysterectomy w/ bilateral salpingoophorectomy      Current Outpatient Prescriptions  Medication Sig Dispense Refill  . amLODipine (NORVASC) 2.5 MG tablet Take 1 tablet (2.5 mg total) by mouth daily.  180 tablet  3  . nebivolol (BYSTOLIC) 10 MG tablet Take 2 tablets (20 mg total) by mouth daily.  180 tablet  0  . olmesartan-hydrochlorothiazide (BENICAR HCT) 40-25 MG per tablet Take 1 tablet by mouth daily.  30 tablet  3  . spironolactone (ALDACTONE) 25 MG tablet Take 1 tablet (25 mg  total) by mouth daily.  90 tablet  3   Current Facility-Administered Medications  Medication Dose Route Frequency Provider Last Rate Last Dose  . cloNIDine (CATAPRES) tablet 0.1 mg  0.1 mg Oral Once Candee Furbish, MD        Allergies:   No Known Allergies  Social History:  The patient  reports that she has never smoked. She does not have any smokeless tobacco history on file. She reports that she does not drink alcohol or use illicit drugs.   Family History  Problem Relation Age of Onset  . Stroke Father   . Heart attack Father   . Hypertension Father   . Diabetes Father    Father had massive MI at age 37's.  ROS:  Please see the history of present illness.   Denies any syncope, bleeding, orthopnea, PND, rash, fevers, chest pain.   All other systems reviewed and negative.   PHYSICAL EXAM: VS:  BP 206/162  Pulse 90  Ht 5\' 2"  (1.575 m)  Wt 145 lb 3.2 oz (65.862 kg)  BMI 26.55 kg/m2 Well nourished, well developed, in no acute distress HEENT: normal, [place]/AT, EOMI Neck: no JVD, normal carotid upstroke, no bruit Cardiac:  normal S1, S2; RRR; no murmur Lungs:  clear to auscultation bilaterally, no wheezing, rhonchi or rales Abd: soft, nontender, no hepatomegaly, no bruits Ext: no edema, 2+ distal pulses Skin: warm and dry GU: deferred Neuro: no focal abnormalities noted, AAO x  3  EKG:  02/16/13 demonstrated sinus rhythm with left ventricular hypertrophy and T-wave inversion in V5 03/04/13-sinus rhythm, 82, LVH, biatrial enlargement, no longer demonstrating T wave inversion in V5.  ECHO: 03/07/13: - Left ventricle: The cavity size was normal. There was severe asymmetric hypertrophy of the septum. Systolic function was normal. The estimated ejection fraction was in the range of 60% to 65%. Wall motion was normal; there were no regional wall motion abnormalities. Doppler parameters are consistent with abnormal left ventricular relaxation (grade 1 diastolic dysfunction). - Aortic  valve: Mild regurgitation. - Mitral valve: Mild regurgitation. - Left atrium: The atrium was mildly dilated. - Right atrium: The atrium was mildly dilated. - Pulmonary arteries: Systolic pressure was mildly increased. Impressions:  - No prior study for comparison; LV function is normal; severe asymmetric septal hypertrophy; no SAM; no LVOT gradient; findings concerning for hypertrophic cardiomyopathy.  Labs: 02/16/13 - Creatinine 2.2, potassium 3.8, BNP 745, LDL 127, hemoglobin 11.4 Prior medical records reviewed.  ASSESSMENT AND PLAN:  1. Uncontrolled hypertension-blood pressure remains elevated. On repeat was 240/140. I will increase her HCTZ portion of Benicar to 25 mg. 40/25 now. Continue with Aldactone 25. Amlodipine 2.5 mg, but she feels like this causes cough. She was hesitant because of some cramping but she was feeling. Continue with Bystolic 20 mg. She had a reassuring renal duplex. We will add hydralazine. Sleep study normal. Will start minoxidil 5mg  PO QD. Discussed potential pericardial effusion. Concerned about stroke.  2. Abnormal EKG-normalization of T-wave inversion. LVH present. Biatrial enlargement.  3. I will see her back in 1 week. Asked her to get BP cuff. Ocie Bob, PA-C has been working on this as well. We need to aggressively decrease her blood pressure. At risk for stroke, heart attack. Consider nephrology referral next point. Knows to call 911 if symptoms develop.   Signed, Candee Furbish, MD [facility]  08/25/2013 10:19 AM

## 2013-08-28 ENCOUNTER — Inpatient Hospital Stay (HOSPITAL_BASED_OUTPATIENT_CLINIC_OR_DEPARTMENT_OTHER)
Admission: EM | Admit: 2013-08-28 | Discharge: 2013-09-01 | DRG: 684 | Disposition: A | Discharge status: Home / Self Care | Payer: 59 | Attending: Internal Medicine | Admitting: Internal Medicine

## 2013-08-28 ENCOUNTER — Emergency Department (HOSPITAL_BASED_OUTPATIENT_CLINIC_OR_DEPARTMENT_OTHER): Payer: 59

## 2013-08-28 ENCOUNTER — Encounter (HOSPITAL_BASED_OUTPATIENT_CLINIC_OR_DEPARTMENT_OTHER): Payer: Self-pay | Admitting: Emergency Medicine

## 2013-08-28 DIAGNOSIS — N184 Chronic kidney disease, stage 4 (severe): Secondary | ICD-10-CM | POA: Diagnosis present

## 2013-08-28 DIAGNOSIS — N189 Chronic kidney disease, unspecified: Secondary | ICD-10-CM

## 2013-08-28 DIAGNOSIS — R9431 Abnormal electrocardiogram [ECG] [EKG]: Secondary | ICD-10-CM

## 2013-08-28 DIAGNOSIS — N039 Chronic nephritic syndrome with unspecified morphologic changes: Secondary | ICD-10-CM

## 2013-08-28 DIAGNOSIS — N179 Acute kidney failure, unspecified: Principal | ICD-10-CM | POA: Diagnosis present

## 2013-08-28 DIAGNOSIS — R0600 Dyspnea, unspecified: Secondary | ICD-10-CM | POA: Diagnosis present

## 2013-08-28 DIAGNOSIS — I517 Cardiomegaly: Secondary | ICD-10-CM

## 2013-08-28 DIAGNOSIS — D631 Anemia in chronic kidney disease: Secondary | ICD-10-CM

## 2013-08-28 DIAGNOSIS — N19 Unspecified kidney failure: Secondary | ICD-10-CM

## 2013-08-28 DIAGNOSIS — D649 Anemia, unspecified: Secondary | ICD-10-CM | POA: Diagnosis present

## 2013-08-28 DIAGNOSIS — I129 Hypertensive chronic kidney disease with stage 1 through stage 4 chronic kidney disease, or unspecified chronic kidney disease: Secondary | ICD-10-CM | POA: Diagnosis present

## 2013-08-28 DIAGNOSIS — Z79899 Other long term (current) drug therapy: Secondary | ICD-10-CM

## 2013-08-28 DIAGNOSIS — E876 Hypokalemia: Secondary | ICD-10-CM | POA: Diagnosis present

## 2013-08-28 DIAGNOSIS — I161 Hypertensive emergency: Secondary | ICD-10-CM

## 2013-08-28 DIAGNOSIS — I10 Essential (primary) hypertension: Secondary | ICD-10-CM | POA: Diagnosis present

## 2013-08-28 DIAGNOSIS — N2581 Secondary hyperparathyroidism of renal origin: Secondary | ICD-10-CM | POA: Diagnosis present

## 2013-08-28 DIAGNOSIS — I5189 Other ill-defined heart diseases: Secondary | ICD-10-CM | POA: Diagnosis present

## 2013-08-28 LAB — BASIC METABOLIC PANEL
Anion gap: 16 — ABNORMAL HIGH (ref 5–15)
BUN: 72 mg/dL — AB (ref 6–23)
CO2: 26 mEq/L (ref 19–32)
Calcium: 9.4 mg/dL (ref 8.4–10.5)
Chloride: 102 mEq/L (ref 96–112)
Creatinine, Ser: 5 mg/dL — ABNORMAL HIGH (ref 0.50–1.10)
GFR, EST AFRICAN AMERICAN: 10 mL/min — AB (ref >90)
GFR, EST NON AFRICAN AMERICAN: 9 mL/min — AB (ref >90)
GLUCOSE: 107 mg/dL — AB (ref 70–99)
POTASSIUM: 3.3 meq/L — AB (ref 3.7–5.3)
Sodium: 144 mEq/L (ref 137–147)

## 2013-08-28 LAB — CBC
HCT: 30.1 % — ABNORMAL LOW (ref 36.0–46.0)
HEMATOCRIT: 31.6 % — AB (ref 36.0–46.0)
HEMOGLOBIN: 10.6 g/dL — AB (ref 12.0–15.0)
Hemoglobin: 10.1 g/dL — ABNORMAL LOW (ref 12.0–15.0)
MCH: 26.6 pg (ref 26.0–34.0)
MCH: 26.8 pg (ref 26.0–34.0)
MCHC: 33.5 g/dL (ref 30.0–36.0)
MCHC: 33.6 g/dL (ref 30.0–36.0)
MCV: 79.2 fL (ref 78.0–100.0)
MCV: 79.8 fL (ref 78.0–100.0)
Platelets: 237 10*3/uL (ref 150–400)
Platelets: 194 10*3/uL (ref 150–400)
RBC: 3.99 MIL/uL (ref 3.87–5.11)
RBC: 3.77 MIL/uL — ABNORMAL LOW (ref 3.87–5.11)
RDW: 15.5 % (ref 11.5–15.5)
RDW: 15.5 % (ref 11.5–15.5)
WBC: 5.9 10*3/uL (ref 4.0–10.5)
WBC: 5.9 10*3/uL (ref 4.0–10.5)

## 2013-08-28 LAB — URINE MICROSCOPIC-ADD ON

## 2013-08-28 LAB — URINALYSIS, ROUTINE W REFLEX MICROSCOPIC
BILIRUBIN URINE: NEGATIVE
Glucose, UA: NEGATIVE mg/dL
Hgb urine dipstick: NEGATIVE
Ketones, ur: NEGATIVE mg/dL
NITRITE: NEGATIVE
Protein, ur: 30 mg/dL — AB
SPECIFIC GRAVITY, URINE: 1.014 (ref 1.005–1.030)
Urobilinogen, UA: 0.2 mg/dL (ref 0.0–1.0)
pH: 5 (ref 5.0–8.0)

## 2013-08-28 LAB — CREATININE, SERUM
Creatinine, Ser: 4.37 mg/dL — ABNORMAL HIGH (ref 0.50–1.10)
GFR calc non Af Amer: 10 mL/min — ABNORMAL LOW (ref >90)
GFR, EST AFRICAN AMERICAN: 12 mL/min — AB (ref >90)

## 2013-08-28 LAB — PRO B NATRIURETIC PEPTIDE: PRO B NATRI PEPTIDE: 8784 pg/mL — AB (ref 0–125)

## 2013-08-28 LAB — TROPONIN I: Troponin I: <0.3 ng/mL (ref <0.30)

## 2013-08-28 MED ORDER — POTASSIUM CHLORIDE CRYS ER 20 MEQ PO TBCR
40.0000 meq | EXTENDED_RELEASE_TABLET | Freq: Once | ORAL | Status: AC
  Administered 2013-08-28: 40 meq via ORAL
  Filled 2013-08-28: qty 2

## 2013-08-28 MED ORDER — DOCUSATE SODIUM 100 MG PO CAPS
100.0000 mg | ORAL_CAPSULE | Freq: Two times a day (BID) | ORAL | Status: DC
  Administered 2013-08-29 – 2013-09-01 (×4): 100 mg via ORAL
  Filled 2013-08-28 (×9): qty 1

## 2013-08-28 MED ORDER — AMLODIPINE BESYLATE 2.5 MG PO TABS
2.5000 mg | ORAL_TABLET | Freq: Every day | ORAL | Status: DC
  Filled 2013-08-28: qty 1

## 2013-08-28 MED ORDER — NEBIVOLOL HCL 10 MG PO TABS
20.0000 mg | ORAL_TABLET | Freq: Every day | ORAL | Status: DC
  Administered 2013-08-28 – 2013-09-01 (×5): 20 mg via ORAL
  Filled 2013-08-28 (×5): qty 2

## 2013-08-28 MED ORDER — AMLODIPINE BESYLATE 5 MG PO TABS
5.0000 mg | ORAL_TABLET | Freq: Every day | ORAL | Status: DC
  Filled 2013-08-28: qty 1

## 2013-08-28 MED ORDER — MINOXIDIL 2.5 MG PO TABS
5.0000 mg | ORAL_TABLET | Freq: Every day | ORAL | Status: DC
  Administered 2013-08-28 – 2013-09-01 (×5): 5 mg via ORAL
  Filled 2013-08-28 (×5): qty 2

## 2013-08-28 MED ORDER — ASPIRIN EC 81 MG PO TBEC
81.0000 mg | DELAYED_RELEASE_TABLET | Freq: Every day | ORAL | Status: DC
  Administered 2013-08-28 – 2013-09-01 (×5): 81 mg via ORAL
  Filled 2013-08-28 (×5): qty 1

## 2013-08-28 MED ORDER — HEPARIN SODIUM (PORCINE) 5000 UNIT/ML IJ SOLN
5000.0000 [IU] | Freq: Three times a day (TID) | INTRAMUSCULAR | Status: DC
  Administered 2013-08-28 – 2013-08-29 (×2): 5000 [IU] via SUBCUTANEOUS
  Filled 2013-08-28 (×5): qty 1

## 2013-08-28 MED ORDER — SODIUM CHLORIDE 0.9 % IV BOLUS (SEPSIS)
1000.0000 mL | Freq: Once | INTRAVENOUS | Status: AC
  Administered 2013-08-28: 1000 mL via INTRAVENOUS

## 2013-08-28 NOTE — Progress Notes (Signed)
Patient admitted to [phone] about 1830. Patient alert and oriented x4. Patient oriented to room and given admission packet for unit.

## 2013-08-28 NOTE — ED Provider Notes (Signed)
CSN: [id number]     Arrival date & time 08/28/13  1140 History   First MD Initiated Contact with Patient 08/28/13 1252     Chief Complaint  Patient presents with  . Shortness of Breath  . Hypertension     (Consider location/radiation/quality/duration/timing/severity/associated sxs/prior Treatment) HPI Pt with hx of hypertension presenting with c/o generalized body aches as well as shortness of breath.  Denies chest pain.  Pt was seen by cardiology last week due to concerns of uncontrolled hypertension. Her BP meds were adjusted and she was given clonidine po in the office on Friday.  Today she feels she is more short of breath than usual.  She also c/o diffuse body aches.  She is concerned that her blood pressure remains elevated.  She also c/o diffuse headache.  No changes in vision or speech, no focal weakness.  There are no other associated systemic symptoms, there are no other alleviating or modifying factors.   Past Medical History  Diagnosis Date  . Hypertension    Past Surgical History  Procedure Laterality Date  . Cesarean section    . Carpal tunnel release    . Total abdominal hysterectomy w/ bilateral salpingoophorectomy     Family History  Problem Relation Age of Onset  . Stroke Father   . Heart attack Father   . Hypertension Father   . Diabetes Father    History  Substance Use Topics  . Smoking status: Never Smoker   . Smokeless tobacco: Not on file  . Alcohol Use: No   OB History   Grav Para Term Preterm Abortions TAB SAB Ect Mult Living                 Review of Systems ROS reviewed and all otherwise negative except for mentioned in HPI    Allergies  Review of patient's allergies indicates no known allergies.  Home Medications   Prior to Admission medications   Medication Sig Start Date End Date Taking? Authorizing Provider  amLODipine (NORVASC) 2.5 MG tablet Take 1 tablet (2.5 mg total) by mouth daily. 03/14/13  Yes Fay Records, MD  minoxidil  (LONITEN) 10 MG tablet Take 0.5 tablets (5 mg total) by mouth daily. 08/25/13   Candee Furbish, MD  nebivolol (BYSTOLIC) 10 MG tablet Take 2 tablets (20 mg total) by mouth daily. 04/07/13   Candee Furbish, MD  olmesartan-hydrochlorothiazide (BENICAR HCT) 40-25 MG per tablet Take 1 tablet by mouth daily. 03/21/13   Candee Furbish, MD  spironolactone (ALDACTONE) 25 MG tablet Take 1 tablet (25 mg total) by mouth daily. 03/14/13   Fay Records, MD   BP 133/88  Pulse 66  Temp(Src) 97.9 F (36.6 C) (Oral)  Resp 16  Ht 5\' 2"  (1.575 m)  Wt 145 lb (65.772 kg)  BMI 26.51 kg/m2  SpO2 99% Vitals reviewed Physical Exam Physical Examination: General appearance - alert, well appearing, and in no distress Mental status - alert, oriented to person, place, and time Eyes - no conjunctival injection, no scleral icterus Mouth - mucous membranes moist, pharynx normal without lesions Neck - supple, no significant adenopathy Chest - clear to auscultation, no wheezes, rales or rhonchi, symmetric air entry Heart - normal rate, regular rhythm, normal S1, S2, no murmurs, rubs, clicks or gallops Abdomen - soft, nontender, nondistended, no masses or organomegaly Extremities - peripheral pulses normal, no pedal edema, no clubbing or cyanosis Skin - normal coloration and turgor, no rashes  ED Course  Procedures (including critical care  time)  3:46 PM called dr. Leonia Reeves, radiology for read on CXR as it is not in epic.  CXR also viewed by me- shows LVH with no overt pulmonary edema  3:50 PM d/w [facility], pt to go telemetry bed at [facility], team 10.  [facility] requested orthostatics which were ordered.  Labs Review Labs Reviewed  CBC - Abnormal; Notable for the following:    Hemoglobin 10.6 (*)    HCT 31.6 (*)    All other components within normal limits  BASIC METABOLIC PANEL - Abnormal; Notable for the following:    Potassium 3.3 (*)    Glucose, Bld 107 (*)    BUN 72 (*)    Creatinine, Ser 5.00 (*)    GFR calc non Af Amer 9 (*)     GFR calc Af Amer 10 (*)    Anion gap 16 (*)    All other components within normal limits  URINALYSIS, ROUTINE W REFLEX MICROSCOPIC - Abnormal; Notable for the following:    APPearance CLOUDY (*)    Protein, ur 30 (*)    Leukocytes, UA TRACE (*)    All other components within normal limits  URINE MICROSCOPIC-ADD ON - Abnormal; Notable for the following:    Squamous Epithelial / LPF FEW (*)    Bacteria, UA FEW (*)    All other components within normal limits  PRO B NATRIURETIC PEPTIDE - Abnormal; Notable for the following:    Pro B Natriuretic peptide (BNP) 8784.0 (*)    All other components within normal limits  TROPONIN I    Imaging Review Dg Chest 2 View  08/28/2013   CLINICAL DATA:  Nausea, dizziness, hypertension  EXAM: CHEST  2 VIEW  COMPARISON:  None  FINDINGS: Cardiac silhouette is enlarged. The aorta is ectatic. There is mild central venous pulmonary congestion. No effusion, infiltrate, or pneumothorax. Mild left basilar atelectasis.  IMPRESSION: Central venous congestion and mild left basilar atelectasis. No edema or infiltrate.  Ectatic aorta   Electronically Signed   By: Suzy Bouchard M.D.   On: 08/28/2013 15:44     EKG Interpretation   Date/Time:  Sunday August 28 2013 13:32:20 EDT Ventricular Rate:  69 PR Interval:  180 QRS Duration: 72 QT Interval:  408 QTC Calculation: 437 R Axis:   -10 Text Interpretation:  Normal sinus rhythm Biatrial enlargement Left  ventricular hypertrophy T wave abnormality, consider lateral ischemia  Abnormal ECG Since previous tracing t wave inversions are new Confirmed by  Canary Brim  MD, Amandeep Hogston [phone]) on 08/28/2013 2:59:16 PM      MDM   Final diagnoses:  Renal failure  Essential hypertension  LVH (left ventricular hypertrophy)  Hypokalemia    Pt presenting with hyertension, malaise and body aches, shortness of breath.  Workup reveals new renal failure with creat 5 compared to her baseline of 2.  CXR shows LVH, no edema.   Blood pressure is reasonably controlled in the ED.  Pt to be admitted to [facility] to hospitalist service.  She is agreeable with this plan and all results discussed with her.    Prior records reviewed and considered during this visit  Xray images reviewed and interpreted by me as well.  Nursing notes including past medical history and social history reviewed and considered in documentation     Threasa Beards, MD 08/28/13 [phone]

## 2013-08-28 NOTE — Progress Notes (Signed)
Spoke with on-call provider regarding medication changes for patient. Norvasc 5 mg was discontinued by admitting physician but on-call provider wanted patient to receive it along with Bystolic then recheck BP before giving Minoxidil. Spoke with pharmacy about discontinuation of medication and was informed that it was the admitting physician who discontinued the med. Spoke with patient regarding the medication and was told by patient that she did not want to take Norvasc even if it were to be reordered by the on-call provider. Patient stated "it just makes me sick and I've told them that I've had so many problems when I take it. I really don't want to take it." Will notify on-call provider and continue to monitor.

## 2013-08-28 NOTE — ED Notes (Signed)
Pt presents to ED with complaints of shortness of breath and hypertension. Pt was in her cardiologist office this past Friday for same thing and was sent home with elevated bp. Pt reports headaches.

## 2013-08-28 NOTE — H&P (Signed)
[facility] History and Physical  Briana Brock TMH:[id number] DOB: Aug 25, 1950 DOA: 08/28/2013  PCP: Stacy Gardner, PA-C   Chief Complaint:   HPI: Briana Brock is a 63 y.o. pleasant female with medical history of glaucoma and hypertension uncontrolled, was watching TV this afternoon when she started feeling generalized body aches, headache and dizziness, she also had some shortness of breath, went to the ED where she was found to be in acute renal failure. On Friday her ophthalmologist made a same day appointment with the cardiologist after she was found to have very high blood pressure during a routine eye exam/glaucoma follow-up, there was a concern for uncontrolled hypertension, blood pressure in the cardiologist office was 240/160 per patient, she was given 2 doses of clonidine, and sent home to followup in one week. She is currently on Benicar recently increased to 40-25 mg tablet by mouth daily, spironolactone 25 mg tablet by mouth daily, Bystolic 20 mg tablet by mouth daily and minoxidil5 mg tablet by mouth daily was added on Friday. Patient also reports headaches associated with her elevated blood pressure. She has had increasing shortness of breath especially during showers in the mornings, she can only walk a block before stopping to rest, she denies having paroxysmal nocturnal dyspnea, denies orthopnea. Her last echocardiogram was February 2015 which showed left ventricular ejection fraction of 60-65%, severe asymmetric hypertrophy of the septum, normal wall motion. Her EKG in February also showed LVH with T wave inversion in V5. Patient has not had a cath. Patient has 2 grown up daughters, she lives at home alone, she works as a Network engineer at [employer]. Her activities of daily living are intact.  General: The patient denies anorexia, fever, weight loss Cardiac: Denies chest pain, syncope, palpitations, pedal edema  Respiratory: Occasional nonproductive cough +, shortness of  breath++, no wheezing GI: Denies severe indigestion/heartburn, abdominal pain, nausea, vomiting, diarrhea and constipation GU: Denies hematuria, incontinence, dysuria  Musculoskeletal: Denies arthritis  Skin: Denies suspicious skin lesions Neurologic: Denies focal weakness or numbness, change in vision  Past Medical History  Diagnosis Date  . Hypertension     Past Surgical History  Procedure Laterality Date  . Cesarean section    . Carpal tunnel release    . Total abdominal hysterectomy w/ bilateral salpingoophorectomy      Social History: Lives at home alone Intact ADLs  No Known Allergies  Family History  Problem Relation Age of Onset  . Stroke Father   . Heart attack Father   . Hypertension Father   . Diabetes Father       Prior to Admission medications   Medication Sig Start Date End Date Taking? Authorizing Provider  minoxidil (LONITEN) 10 MG tablet Take 0.5 tablets (5 mg total) by mouth daily. 08/25/13   Candee Furbish, MD  nebivolol (BYSTOLIC) 10 MG tablet Take 2 tablets (20 mg total) by mouth daily. 04/07/13   Candee Furbish, MD  olmesartan-hydrochlorothiazide (BENICAR HCT) 40-25 MG per tablet Take 1 tablet by mouth daily. 03/21/13   Candee Furbish, MD  spironolactone (ALDACTONE) 25 MG tablet Take 1 tablet (25 mg total) by mouth daily. 03/14/13   Fay Records, MD     Physical Exam: Filed Vitals:   08/28/13 1530 08/28/13 1555 08/28/13 1910 08/28/13 2026  BP: 133/88 155/96 188/102 176/98  Pulse: 66 70 74 78  Temp:   98.5 F (36.9 C) 98.5 F (36.9 C)  TempSrc:   Oral Oral  Resp:   18 18  Height:  Weight:      SpO2: 99% 100% 99% 99%     General: Middle aged woman, comfortable at rest, not acutely ill-looking but anxious HEENT: Normocephalic and Atraumatic, Mucous membranes pink                PERRLA; EOM intact; No scleral icterus,                 Nares: Patent, Oropharynx: Clear, Fair Dentition                 Neck: FROM, no cervical lymphadenopathy,  thyromegaly, carotid bruit or JVD;  Breasts: deferred CHEST WALL: No tenderness  CHEST: Normal respiration, clear to auscultation bilaterally  HEART: Regular rate and rhythm; no murmurs rubs or gallops  BACK: No kyphosis or scoliosis; no CVA tenderness  ABDOMEN: Positive Bowel Sounds, soft, non-tender; no masses, no organomegaly Rectal Exam: deferred EXTREMITIES: No cyanosis, clubbing, or edema Genitalia: not examined  SKIN:  no rash or ulceration  CNS: Alert and Oriented x 4, Nonfocal exam, CN 2-12 intact  Labs on Admission:  Basic Metabolic Panel:  Recent Labs Lab 08/28/13 1210 08/28/13 2020  NA 144  --   K 3.3*  --   CL 102  --   CO2 26  --   GLUCOSE 107*  --   BUN 72*  --   CREATININE 5.00* 4.37*  CALCIUM 9.4  --    Liver Function Tests: No results found for this basename: AST, ALT, ALKPHOS, BILITOT, PROT, ALBUMIN,  in the last 168 hours No results found for this basename: LIPASE, AMYLASE,  in the last 168 hours No results found for this basename: AMMONIA,  in the last 168 hours CBC:  Recent Labs Lab 08/28/13 1210 08/28/13 2020  WBC 5.9 5.9  HGB 10.6* 10.1*  HCT 31.6* 30.1*  MCV 79.2 79.8  PLT 237 194   Cardiac Enzymes:  Recent Labs Lab 08/28/13 1210  TROPONINI <0.30    BNP (last 3 results)  Recent Labs  02/16/13 1139 08/28/13 1210  PROBNP 745.0* 8784.0*   CBG: No results found for this basename: GLUCAP,  in the last 168 hours  Radiological Exams on Admission: Dg Chest 2 View  08/28/2013   CLINICAL DATA:  Nausea, dizziness, hypertension  EXAM: CHEST  2 VIEW  COMPARISON:  None  FINDINGS: Cardiac silhouette is enlarged. The aorta is ectatic. There is mild central venous pulmonary congestion. No effusion, infiltrate, or pneumothorax. Mild left basilar atelectasis.  IMPRESSION: Central venous congestion and mild left basilar atelectasis. No edema or infiltrate.  Ectatic aorta   Electronically Signed   By: Suzy Bouchard M.D.   On: 08/28/2013 15:44     EKG: Independently reviewed by me, shows  Sinus rhythm with rate of 69, LVH, T-wave inversion in lateral leads  Assessment/Plan 1. Acute on chronic renal failure Patient got 1 L of fluid in the ED  We will hold ACE inhibitor and spironolactone Recheck BMP in the morning Nephrology consult  2. Hypertensive emergency Gentle blood pressure control Will continue beta blocker Continue minoxidil  Will add hydralazine  3. Hypokalemia Potassium has been replaced Recheck serum level in the morning  4. ? Cardiorenal syndrome Elevated BNP At this time we'll hold off on diuretic, patient is comfortable at rest, chest is clear to auscultation Repeat chest x-ray tomorrow   Consulted: Patient needs Nephrology consult  Code Status: Full Code  Family Communication: None  DVT Prophylaxis: [place] Heparin  Time spent: 45 min  Angelica Chessman, MD [facility]  If 7PM-7AM, please contact night-coverage [email or web address] 08/28/2013, 9:09 PM

## 2013-08-28 NOTE — Progress Notes (Addendum)
Report received from [place], [place] for admission to [phone] at [facility]

## 2013-08-28 NOTE — Plan of Care (Signed)
63 y/o female being admitted from [facility] for acute renal failure - takes ACE and Aldactone. Will likely need a renal ultrasound. PMH: of Hypertrophic cardiomyopathy (septal hypertrophy), HTN.   Debbe Odea, MD

## 2013-08-29 ENCOUNTER — Inpatient Hospital Stay (HOSPITAL_COMMUNITY): Payer: 59

## 2013-08-29 DIAGNOSIS — I5189 Other ill-defined heart diseases: Secondary | ICD-10-CM | POA: Diagnosis present

## 2013-08-29 DIAGNOSIS — D649 Anemia, unspecified: Secondary | ICD-10-CM | POA: Diagnosis present

## 2013-08-29 DIAGNOSIS — I519 Heart disease, unspecified: Secondary | ICD-10-CM

## 2013-08-29 DIAGNOSIS — I10 Essential (primary) hypertension: Secondary | ICD-10-CM | POA: Diagnosis present

## 2013-08-29 LAB — BASIC METABOLIC PANEL
ANION GAP: 13 (ref 5–15)
BUN: 61 mg/dL — ABNORMAL HIGH (ref 6–23)
CALCIUM: 9.3 mg/dL (ref 8.4–10.5)
CO2: 22 mEq/L (ref 19–32)
CREATININE: 4.53 mg/dL — AB (ref 0.50–1.10)
Chloride: 105 mEq/L (ref 96–112)
GFR calc Af Amer: 11 mL/min — ABNORMAL LOW (ref >90)
GFR calc non Af Amer: 10 mL/min — ABNORMAL LOW (ref >90)
Glucose, Bld: 93 mg/dL (ref 70–99)
Potassium: 4.3 mEq/L (ref 3.7–5.3)
SODIUM: 140 meq/L (ref 137–147)

## 2013-08-29 LAB — CBC
HCT: 28.6 % — ABNORMAL LOW (ref 36.0–46.0)
HEMOGLOBIN: 9.4 g/dL — AB (ref 12.0–15.0)
MCH: 26 pg (ref 26.0–34.0)
MCHC: 32.9 g/dL (ref 30.0–36.0)
MCV: 79 fL (ref 78.0–100.0)
Platelets: 212 10*3/uL (ref 150–400)
RBC: 3.62 MIL/uL — ABNORMAL LOW (ref 3.87–5.11)
RDW: 15.5 % (ref 11.5–15.5)
WBC: 6.6 10*3/uL (ref 4.0–10.5)

## 2013-08-29 LAB — MAGNESIUM: Magnesium: 1.9 mg/dL (ref 1.5–2.5)

## 2013-08-29 MED ORDER — PAROXETINE HCL 20 MG PO TABS: 20.0000 mg | ORAL_TABLET | Freq: Every day | ORAL | Status: DC

## 2013-08-29 MED ORDER — SODIUM CHLORIDE 0.9 % IV SOLN
INTRAVENOUS | Status: DC
  Administered 2013-08-29: 14:00:00 via INTRAVENOUS

## 2013-08-29 NOTE — Consult Note (Signed)
Reason for Consult:Acute on CKD Referring Physician: [name] is an 63 y.o. female.  HPI: Patient has a PMH of uncontrolled HTN who presented to the hospital w/ complaints of HA, dyspnea, and generalized body aches.  On presentation to the ED she was found to be in ARF with a SCr of 5 up from a baseline of ~2.2. Of note she recently was seen on 8/13 by her ophthalmologist for visual changes and saw referred for a same day appointment with her Cardiologist Dr. Marlou Porch were her BP was found to be 240/160.  She was treated acutely with clonidine in the office and Minoxidil 5mg  was added to her BP regimen.  She denies any use of NSAIDs. She reports adherence to her BP medication regimen.  She denies any use of other OTC medications or herbal supplements.  She denies any abdominal pain, difficulty urinating, she denies any flank pain, fever or chills.  She denies any palpations, or seen of impending doom.  She does reports some occasional night sweats. She does report an episode of diarrhea on Friday and some nausea without vomiting. Trend in Creatinine: Creatinine, Ser  Date/Time Value Ref Range Status  08/29/2013  5:20 AM 4.53* 0.50 - 1.10 mg/dL Final  08/28/2013  8:20 PM 4.37* 0.50 - 1.10 mg/dL Final  08/28/2013 12:10 PM 5.00* 0.50 - 1.10 mg/dL Final  03/18/2013  9:18 AM 2.3* 0.4 - 1.2 mg/dL Final  02/16/2013 11:39 AM 2.2* 0.4 - 1.2 mg/dL Final  01/19/2013 12:45 AM 2.30* 0.50 - 1.10 mg/dL Final    PMH:   Past Medical History  Diagnosis Date  . Hypertension     PSH:   Past Surgical History  Procedure Laterality Date  . Cesarean section    . Carpal tunnel release    . Total abdominal hysterectomy w/ bilateral salpingoophorectomy      Allergies: No Known Allergies  Medications:   Prior to Admission medications   Medication Sig Start Date End Date Taking? Authorizing Provider  minoxidil (LONITEN) 10 MG tablet Take 0.5 tablets (5 mg total) by mouth daily. 08/25/13  Yes  Candee Furbish, MD  nebivolol (BYSTOLIC) 10 MG tablet Take 2 tablets (20 mg total) by mouth daily. 04/07/13  Yes Candee Furbish, MD  olmesartan-hydrochlorothiazide (BENICAR HCT) 40-25 MG per tablet Take 1 tablet by mouth daily. 03/21/13  Yes Candee Furbish, MD  spironolactone (ALDACTONE) 25 MG tablet Take 1 tablet (25 mg total) by mouth daily. 03/14/13  Yes Fay Records, MD    Inpatient medications: . aspirin EC  81 mg Oral Daily  . docusate sodium  100 mg Oral BID  . minoxidil  5 mg Oral Daily  . nebivolol  20 mg Oral Daily    Discontinued Meds:   Medications Discontinued During This Encounter  Medication Reason  . amLODipine (NORVASC) tablet 2.5 mg   . amLODipine (NORVASC) 2.5 MG tablet Discontinued by provider  . amLODipine (NORVASC) tablet 5 mg   . PARoxetine (PAXIL) 20 MG tablet Stop Taking at Discharge  . heparin injection 5,000 Units     Social History:  reports that she has never smoked. She does not have any smokeless tobacco history on file. She reports that she does not drink alcohol or use illicit drugs.  Family History:   Family History  Problem Relation Age of Onset  . Stroke Father   . Heart attack Father   . Hypertension Father   . Diabetes Father     Review  of Systems  Constitutional: Positive for malaise/fatigue. Negative for fever and chills.  Eyes: Positive for blurred vision. Negative for photophobia.  Respiratory: Positive for shortness of breath. Negative for cough, sputum production and wheezing.   Cardiovascular: Negative for chest pain, palpitations and leg swelling.  Gastrointestinal: Positive for nausea (on friday 8/14) and diarrhea (1x on friday 8/14). Negative for vomiting and abdominal pain.  Genitourinary: Negative for dysuria, urgency, frequency, hematuria and flank pain.  Musculoskeletal: Positive for joint pain (left knee). Negative for myalgias.  Skin: Negative for rash.  Neurological: Positive for headaches. Negative for dizziness, sensory change and  focal weakness.  Psychiatric/Behavioral: Negative for substance abuse.    Weight change:  No intake or output data in the 24 hours ending 08/29/13 1359 BP 127/84  Pulse 83  Temp(Src) 99 F (37.2 C) (Oral)  Resp 18  Ht 5\' 2"  (1.575 m)  Wt 145 lb (65.772 kg)  BMI 26.51 kg/m2  SpO2 100% Filed Vitals:   08/28/13 2026 08/28/13 2203 08/28/13 2300 08/29/13 0440  BP: 176/98 158/91 127/78 127/84  Pulse: 78 81 80 83  Temp: 98.5 F (36.9 C)   99 F (37.2 C)  TempSrc: Oral   Oral  Resp: 18   18  Height:      Weight:      SpO2: 99%   100%     General: resting in bed, NAD HEENT: PERRL, EOMI, no scleral icterus Cardiac: RRR, 2/6 systolic murmur Pulm: CTAB, no w/r/r Abd: soft, nontender, nondistended, BS present Back: No CVA tenderness Ext: warm and well perfused, no pedal edema Neuro: alert and oriented X3, cranial nerves II-XII grossly intact   Labs: Basic Metabolic Panel:  Recent Labs Lab 08/28/13 1210 08/28/13 2020 08/29/13 0520  NA 144  --  140  K 3.3*  --  4.3  CL 102  --  105  CO2 26  --  22  GLUCOSE 107*  --  93  BUN 72*  --  61*  CREATININE 5.00* 4.37* 4.53*  CALCIUM 9.4  --  9.3   Liver Function Tests: No results found for this basename: AST, ALT, ALKPHOS, BILITOT, PROT, ALBUMIN,  in the last 168 hours No results found for this basename: LIPASE, AMYLASE,  in the last 168 hours No results found for this basename: AMMONIA,  in the last 168 hours CBC:  Recent Labs Lab 08/28/13 1210 08/28/13 2020 08/29/13 0520  WBC 5.9 5.9 6.6  HGB 10.6* 10.1* 9.4*  HCT 31.6* 30.1* 28.6*  MCV 79.2 79.8 79.0  PLT 237 194 212   PT/INR: @LABRCNTIP (inr:5) Cardiac Enzymes: ) Recent Labs Lab 08/28/13 1210  TROPONINI <0.30   CBG: No results found for this basename: GLUCAP,  in the last 168 hours  Iron Studies: No results found for this basename: IRON, TIBC, TRANSFERRIN, FERRITIN,  in the last 168 hours  Xrays/Other Studies: X-ray Chest Pa And Lateral   08/29/2013    CLINICAL DATA:  Congestive heart failure.  EXAM: CHEST  2 VIEW  COMPARISON:  08/28/2013.  FINDINGS: Trachea is midline. Heart is enlarged. Thoracic aorta is tortuous. Subsegmental atelectasis or scarring in the lingula. There may be subtle bibasilar interstitial prominence and indistinctness. No pleural fluid.  IMPRESSION: 1. Question trace dependent pulmonary edema. 2. Marked aortic tortuosity.   Electronically Signed   By: Lorin Picket M.D.   On: 08/29/2013 07:12   Dg Chest 2 View  08/28/2013   CLINICAL DATA:  Nausea, dizziness, hypertension  EXAM: CHEST  2 VIEW  COMPARISON:  None  FINDINGS: Cardiac silhouette is enlarged. The aorta is ectatic. There is mild central venous pulmonary congestion. No effusion, infiltrate, or pneumothorax. Mild left basilar atelectasis.  IMPRESSION: Central venous congestion and mild left basilar atelectasis. No edema or infiltrate.  Ectatic aorta   Electronically Signed   By: Suzy Bouchard M.D.   On: 08/28/2013 15:44   Renal Artery Duplex 03/25/13: Normal renal artery flow, left renal cyst noted  Renal U/S 10/14/13: right kidney 10 cm increased renal cortical echogenicity, cortical thickness normal, left kidney 11.4 cm extrarenal pelvis is present, increased cortical echogenicity.  U/A: specific gravity 1.014, protein 30, nitrite neg, trace leuk, amorphous urates/phosphates, few bacteria  Assessment/Plan: 1.  Acute on Chronic Kidney Disease stage IV (Baseline SCr 2.2-2.3):  SCr on presentation 5 which has trended down to 4.53 with 1L of IVF.  Her presentation may be due to progression of her renal disease from uncontrolled HTN as she has opthalmologic manifestation.  We will however complete her workup to rule out other causes by checking Renin/Aldosterone, SPEP, UPEP, Catecholamines, TSH, Cortisol, and repeating a renal U/S.  Patient is currently euvolemic and will decrease IVF to 50cc/hr.  Will continue to trend her renal function and obtain a CMP in the AM along  with phosphorous. 1. 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  Will need to be education regarding RRT and evaluation by VVS for vascular access placement given her advanced CKD at baseline. 2. Hypertensive Urgency: Currently normotensive on Bystolic 20mg , and Minoxidil 5mg .  Holding Benicar/ HCTZ, Spironolactone, patient refused Amlodipine (would continue to avoid Amlodipine if patient is on minoxidil).  Has had HA's, retinal hemorrhages, LVH/hypertensive cardiomyopathy, and progressive CKD over the last week.  Now has responded to only bystolic and minoxidil.  She has had a negative renal artery duplex for RAS.  Will order hormones as above and cont to follow closely. 3. Anemia in CKD: Anemia panel ordered for tomorrow am 1. Will check iron stores and may need aranesp once her BP is under better control.    Lucious Groves 08/29/2013, 1:59 PM  Internal Medicine PGY-2 Pager [phone]  I have seen and examined this patient and agree with plan as outlined by Dr. Heber [place] except for the additions made. Izic Stfort A,MD 08/29/2013 4:54 PM

## 2013-08-29 NOTE — Progress Notes (Addendum)
[facility] PROGRESS NOTE  Briana Brock ION:[id number] DOB: [birth year] DOA: 08/28/2013 PCP: Stacy Gardner, PA-C  Chart reviewed.  Assessment/Plan: Principal Problem:   Acute on chronic renal failure: creatine only down 0.5 after a liter saline. Will resume. No evidence of fluid overload clinically. Will get renal US. Creat 2.3 in Feb. Never seen by nephrology. Have consulted Dr. Marval Regal.  Benicar, HCTZ, spironolactone held. Recently, benicar/HCT increased and started on minoxidil. Will copy note to Dr. Marlou Porch Active Problems:   Hypokalemia repleted   Essential hypertension, benign controlled currently   Normocytic Anemia: will check anemia panel and hemoccults   Diastolic dysfunction: proBNP high, but no clinical signs of fluid overload. Likely due to renal failure  Does not want heparin [place]. Will order scds and frequent ambulation  Code Status:  full Family Communication:   Disposition Plan:  home  Consultants:  nephrology  Procedures:     Antibiotics:    HPI/Subjective: No complaints. No weakness, dyspnea or CP. No edema  Objective: Filed Vitals:   08/29/13 0440  BP: 127/84  Pulse: 83  Temp: 99 F (37.2 C)  Resp: 18   No intake or output data in the 24 hours ending 08/29/13 1307 Filed Weights   08/28/13 1146  Weight: 65.772 kg (145 lb)    Exam:   General:  Comfortable. Alert and oriented  Cardiovascular: RRR without NGR  Respiratory: CTA without WRR  Abdomen: S, NT, ND  Ext: no CCE  Basic Metabolic Panel:  Recent Labs Lab 08/28/13 1210 08/28/13 2020 08/29/13 0520  NA 144  --  140  K 3.3*  --  4.3  CL 102  --  105  CO2 26  --  22  GLUCOSE 107*  --  93  BUN 72*  --  61*  CREATININE 5.00* 4.37* 4.53*  CALCIUM 9.4  --  9.3  MG  --   --  1.9   Liver Function Tests: No results found for this basename: AST, ALT, ALKPHOS, BILITOT, PROT, ALBUMIN,  in the last 168 hours No results found for this basename: LIPASE, AMYLASE,  in  the last 168 hours No results found for this basename: AMMONIA,  in the last 168 hours CBC:  Recent Labs Lab 08/28/13 1210 08/28/13 2020 08/29/13 0520  WBC 5.9 5.9 6.6  HGB 10.6* 10.1* 9.4*  HCT 31.6* 30.1* 28.6*  MCV 79.2 79.8 79.0  PLT 237 194 212   Cardiac Enzymes:  Recent Labs Lab 08/28/13 1210  TROPONINI <0.30   BNP (last 3 results)  Recent Labs  02/16/13 1139 08/28/13 1210  PROBNP 745.0* 8784.0*   CBG: No results found for this basename: GLUCAP,  in the last 168 hours  No results found for this or any previous visit (from the past 240 hour(s)).   Studies: X-ray Chest Pa And Lateral   08/29/2013   CLINICAL DATA:  Congestive heart failure.  EXAM: CHEST  2 VIEW  COMPARISON:  08/28/2013.  FINDINGS: Trachea is midline. Heart is enlarged. Thoracic aorta is tortuous. Subsegmental atelectasis or scarring in the lingula. There may be subtle bibasilar interstitial prominence and indistinctness. No pleural fluid.  IMPRESSION: 1. Question trace dependent pulmonary edema. 2. Marked aortic tortuosity.   Electronically Signed   By: Lorin Picket M.D.   On: 08/29/2013 07:12   Dg Chest 2 View  08/28/2013   CLINICAL DATA:  Nausea, dizziness, hypertension  EXAM: CHEST  2 VIEW  COMPARISON:  None  FINDINGS: Cardiac silhouette is enlarged. The aorta is  ectatic. There is mild central venous pulmonary congestion. No effusion, infiltrate, or pneumothorax. Mild left basilar atelectasis.  IMPRESSION: Central venous congestion and mild left basilar atelectasis. No edema or infiltrate.  Ectatic aorta   Electronically Signed   By: Suzy Bouchard M.D.   On: 08/28/2013 15:44    Scheduled Meds: . aspirin EC  81 mg Oral Daily  . docusate sodium  100 mg Oral BID  . heparin  5,000 Units Subcutaneous 3 times per day  . minoxidil  5 mg Oral Daily  . nebivolol  20 mg Oral Daily   Continuous Infusions: . sodium chloride      Time spent: 35 minutes  [facility] Pager [phone]. If 7PM-7AM, please contact night-coverage at [email or web address], password [facility] 08/29/2013, 1:07 PM  LOS: 1 day

## 2013-08-29 NOTE — Progress Notes (Signed)
Utilization review completed.  

## 2013-08-30 ENCOUNTER — Inpatient Hospital Stay (HOSPITAL_COMMUNITY): Payer: 59

## 2013-08-30 DIAGNOSIS — N19 Unspecified kidney failure: Secondary | ICD-10-CM

## 2013-08-30 LAB — COMPREHENSIVE METABOLIC PANEL
ALK PHOS: 115 U/L (ref 39–117)
ALT: 16 U/L (ref 0–35)
AST: 16 U/L (ref 0–37)
Albumin: 4.1 g/dL (ref 3.5–5.2)
Anion gap: 14 (ref 5–15)
BUN: 57 mg/dL — ABNORMAL HIGH (ref 6–23)
CO2: 21 meq/L (ref 19–32)
Calcium: 9.6 mg/dL (ref 8.4–10.5)
Chloride: 107 mEq/L (ref 96–112)
Creatinine, Ser: 4.56 mg/dL — ABNORMAL HIGH (ref 0.50–1.10)
GFR, EST AFRICAN AMERICAN: 11 mL/min — AB (ref >90)
GFR, EST NON AFRICAN AMERICAN: 10 mL/min — AB (ref >90)
GLUCOSE: 102 mg/dL — AB (ref 70–99)
POTASSIUM: 4.3 meq/L (ref 3.7–5.3)
SODIUM: 142 meq/L (ref 137–147)
Total Bilirubin: 0.3 mg/dL (ref 0.3–1.2)
Total Protein: 7.5 g/dL (ref 6.0–8.3)

## 2013-08-30 LAB — MAGNESIUM: Magnesium: 1.9 mg/dL (ref 1.5–2.5)

## 2013-08-30 LAB — CBC
HCT: 29.3 % — ABNORMAL LOW (ref 36.0–46.0)
Hemoglobin: 9.7 g/dL — ABNORMAL LOW (ref 12.0–15.0)
MCH: 26.1 pg (ref 26.0–34.0)
MCHC: 33.1 g/dL (ref 30.0–36.0)
MCV: 78.8 fL (ref 78.0–100.0)
Platelets: 242 10*3/uL (ref 150–400)
RBC: 3.72 MIL/uL — ABNORMAL LOW (ref 3.87–5.11)
RDW: 15.4 % (ref 11.5–15.5)
WBC: 6.2 10*3/uL (ref 4.0–10.5)

## 2013-08-30 LAB — RETICULOCYTES
RBC.: 3.72 MIL/uL — ABNORMAL LOW (ref 3.87–5.11)
RETIC CT PCT: 1.6 % (ref 0.4–3.1)
Retic Count, Absolute: 59.5 10*3/uL (ref 19.0–186.0)

## 2013-08-30 LAB — IRON AND TIBC
Iron: 65 ug/dL (ref 42–135)
Saturation Ratios: 26 % (ref 20–55)
TIBC: 250 ug/dL (ref 250–470)
UIBC: 185 ug/dL (ref 125–400)

## 2013-08-30 LAB — PARATHYROID HORMONE, INTACT (NO CA): PTH: 435.4 pg/mL — ABNORMAL HIGH (ref 14.0–72.0)

## 2013-08-30 LAB — FERRITIN: Ferritin: 144 ng/mL (ref 10–291)

## 2013-08-30 LAB — CORTISOL: Cortisol, Plasma: 25.9 ug/dL

## 2013-08-30 LAB — TSH: TSH: 2.3 u[IU]/mL (ref 0.350–4.500)

## 2013-08-30 LAB — PHOSPHORUS: Phosphorus: 3.8 mg/dL (ref 2.3–4.6)

## 2013-08-30 LAB — VITAMIN B12: Vitamin B-12: 707 pg/mL (ref 211–911)

## 2013-08-30 LAB — FOLATE: FOLATE: 12.7 ng/mL

## 2013-08-30 NOTE — Progress Notes (Signed)
[facility] PROGRESS NOTE  Briana Brock NIO:[id number] DOB: October 16, 1950 DOA: 08/28/2013 PCP: Stacy Gardner, PA-C  Chart reviewed.  Assessment/Plan: Principal Problem:   Acute on chronic renal failure vs. Progression of CKD.  Appreciate Dr. Marval Regal.  Creat unchanged from 8/17, despite hydration.  Active Problems:   Hypokalemia repleted   Essential hypertension, benign controlled currently   Normocytic Anemia: ferritin, folate, B12 ok. Likely secondary to CKD   Diastolic dysfunction: proBNP high, but no clinical signs of fluid overload. Likely due to renal failure  Does not want heparin [place]. Will order scds and frequent ambulation. D/c telemetry  Code Status:  full Family Communication:   Disposition Plan:  home  Consultants:  nephrology  Procedures:     Antibiotics:    HPI/Subjective: No complaints. No weakness, dyspnea or CP. No edema  Objective: Filed Vitals:   08/30/13 0932  BP: 124/76  Pulse:   Temp:   Resp:     Intake/Output Summary (Last 24 hours) at 08/30/13 1323 Last data filed at 08/30/13 0900  Gross per 24 hour  Intake    150 ml  Output      0 ml  Net    150 ml   Filed Weights   08/28/13 1146  Weight: 65.772 kg (145 lb)    Exam:   General:  Asleep arousable  Cardiovascular: RRR without NGR  Respiratory: CTA without WRR  Abdomen: S, NT, ND  Ext: no CCE  Basic Metabolic Panel:  Recent Labs Lab 08/28/13 1210 08/28/13 2020 08/29/13 0520 08/30/13 0550  NA 144  --  140 142  K 3.3*  --  4.3 4.3  CL 102  --  105 107  CO2 26  --  22 21  GLUCOSE 107*  --  93 102*  BUN 72*  --  61* 57*  CREATININE 5.00* 4.37* 4.53* 4.56*  CALCIUM 9.4  --  9.3 9.6  MG  --   --  1.9 1.9  PHOS  --   --   --  3.8   Liver Function Tests:  Recent Labs Lab 08/30/13 0550  AST 16  ALT 16  ALKPHOS 115  BILITOT 0.3  PROT 7.5  ALBUMIN 4.1   No results found for this basename: LIPASE, AMYLASE,  in the last 168 hours No results found  for this basename: AMMONIA,  in the last 168 hours CBC:  Recent Labs Lab 08/28/13 1210 08/28/13 2020 08/29/13 0520 08/30/13 0550  WBC 5.9 5.9 6.6 6.2  HGB 10.6* 10.1* 9.4* 9.7*  HCT 31.6* 30.1* 28.6* 29.3*  MCV 79.2 79.8 79.0 78.8  PLT 237 194 212 242   Cardiac Enzymes:  Recent Labs Lab 08/28/13 1210  TROPONINI <0.30   BNP (last 3 results)  Recent Labs  02/16/13 1139 08/28/13 1210  PROBNP 745.0* 8784.0*   CBG: No results found for this basename: GLUCAP,  in the last 168 hours  No results found for this or any previous visit (from the past 240 hour(s)).   Studies: X-ray Chest Pa And Lateral   08/29/2013   CLINICAL DATA:  Congestive heart failure.  EXAM: CHEST  2 VIEW  COMPARISON:  08/28/2013.  FINDINGS: Trachea is midline. Heart is enlarged. Thoracic aorta is tortuous. Subsegmental atelectasis or scarring in the lingula. There may be subtle bibasilar interstitial prominence and indistinctness. No pleural fluid.  IMPRESSION: 1. Question trace dependent pulmonary edema. 2. Marked aortic tortuosity.   Electronically Signed   By: Lorin Picket M.D.   On: 08/29/2013  07:12   US Renal  08/30/2013   CLINICAL DATA:  Acute on chronic renal failure, history hypertension, diabetes  EXAM: RENAL/URINARY TRACT ULTRASOUND COMPLETE  COMPARISON:  10/21/2011  FINDINGS: Right Kidney:  Length: 10.3 cm. Normal cortical thickness. Increased cortical echogenicity. No mass, hydronephrosis or shadowing calcification. No perinephric fluid.  Left Kidney:  Length: 11.0 cm. Normal cortical thickness. Increased cortical echogenicity. No mass, hydronephrosis or shadowing calcification. No perinephric fluid.  Bladder:  Partially distended, grossly unremarkable.  IMPRESSION: Medical renal disease changes of both kidneys without evidence of renal mass or hydronephrosis.   Electronically Signed   By: Lavonia Dana M.D.   On: 08/30/2013 10:24    Scheduled Meds: . aspirin EC  81 mg Oral Daily  . docusate  sodium  100 mg Oral BID  . minoxidil  5 mg Oral Daily  . nebivolol  20 mg Oral Daily   Continuous Infusions:    Time spent: 35 minutes  [place] [facility] Pager [phone]. If 7PM-7AM, please contact night-coverage at [email or web address], password [facility] 08/30/2013, 1:23 PM  LOS: 2 days

## 2013-08-30 NOTE — Care Management Note (Signed)
    Page 1 of 1   09/01/2013     12:27:05 PM CARE MANAGEMENT NOTE 09/01/2013  Patient:  Briana Brock, Briana Brock   Account Number:  [id number]  Date Initiated:  08/30/2013  Documentation initiated by:  Tomi Bamberger  Subjective/Objective Assessment:   dx renal failure, sob  admit- lives alone.     Action/Plan:   Anticipated DC Date:  08/31/2013   Anticipated DC Plan:  [place]  CM consult      Choice offered to / List presented to:             Status of service:  Completed, signed off Medicare Important Message given?   (If response is "NO", the following Medicare IM given date fields will be blank) Date Medicare IM given:   Medicare IM given by:   Date Additional Medicare IM given:   Additional Medicare IM given by:    Discharge Disposition:  HOME/SELF CARE  Per UR Regulation:  Reviewed for med. necessity/level of care/duration of stay  If discussed at [place] of Stay Meetings, dates discussed:    Comments:  8j/20/115 [place], BSN [phone] patient dc to home today, no needs anticipated.  08/30/13 [place], BSN [phone] patient lives alone, NCM will continue to follow for dc needs.

## 2013-08-30 NOTE — Plan of Care (Signed)
Problem: Phase I Progression Outcomes Goal: Other Phase I Outcomes/Goals Outcome: Completed/Met Date Met:  08/30/13 Patient received Living with renal failure book and watched videos [phone] and 410

## 2013-08-30 NOTE — Progress Notes (Signed)
S:Patient reports feeling well, she denies any acute complaints, denies HA, vision changes, chest pain, abdominal pain.  She reports she is urinating well with dysuria. O:BP 115/71  Pulse 80  Temp(Src) 98.8 F (37.1 C) (Oral)  Resp 17  Ht 5\' 2"  (1.575 m)  Wt 145 lb (65.772 kg)  BMI 26.51 kg/m2  SpO2 98% No intake or output data in the 24 hours ending 08/30/13 0902 Intake/Output:    Intake/Output this shift:    Weight change:  TIR:WERXVQM in bed comfortably CVS:RRR systolic murmur Resp:CTAB no w/r/r [id number] Bowel sounds, nontender, non distended Ext:no edema   Recent Labs Lab 08/28/13 1210 08/28/13 2020 08/29/13 0520 08/30/13 0550  NA 144  --  140 142  K 3.3*  --  4.3 4.3  CL 102  --  105 107  CO2 26  --  22 21  GLUCOSE 107*  --  93 102*  BUN 72*  --  61* 57*  CREATININE 5.00* 4.37* 4.53* 4.56*  ALBUMIN  --   --   --  4.1  CALCIUM 9.4  --  9.3 9.6  PHOS  --   --   --  3.8  AST  --   --   --  16  ALT  --   --   --  16   Liver Function Tests:  Recent Labs Lab 08/30/13 0550  AST 16  ALT 16  ALKPHOS 115  BILITOT 0.3  PROT 7.5  ALBUMIN 4.1   No results found for this basename: LIPASE, AMYLASE,  in the last 168 hours No results found for this basename: AMMONIA,  in the last 168 hours CBC:  Recent Labs Lab 08/28/13 1210 08/28/13 2020 08/29/13 0520 08/30/13 0550  WBC 5.9 5.9 6.6 6.2  HGB 10.6* 10.1* 9.4* 9.7*  HCT 31.6* 30.1* 28.6* 29.3*  MCV 79.2 79.8 79.0 78.8  PLT 237 194 212 242   Cardiac Enzymes:  Recent Labs Lab 08/28/13 1210  TROPONINI <0.30   CBG: No results found for this basename: GLUCAP,  in the last 168 hours  Iron Studies: No results found for this basename: IRON, TIBC, TRANSFERRIN, FERRITIN,  in the last 72 hours Studies/Results: X-ray Chest Pa And Lateral   08/29/2013   CLINICAL DATA:  Congestive heart failure.  EXAM: CHEST  2 VIEW  COMPARISON:  08/28/2013.  FINDINGS: Trachea is midline. Heart is enlarged. Thoracic aorta  is tortuous. Subsegmental atelectasis or scarring in the lingula. There may be subtle bibasilar interstitial prominence and indistinctness. No pleural fluid.  IMPRESSION: 1. Question trace dependent pulmonary edema. 2. Marked aortic tortuosity.   Electronically Signed   By: Lorin Picket M.D.   On: 08/29/2013 07:12   Dg Chest 2 View  08/28/2013   CLINICAL DATA:  Nausea, dizziness, hypertension  EXAM: CHEST  2 VIEW  COMPARISON:  None  FINDINGS: Cardiac silhouette is enlarged. The aorta is ectatic. There is mild central venous pulmonary congestion. No effusion, infiltrate, or pneumothorax. Mild left basilar atelectasis.  IMPRESSION: Central venous congestion and mild left basilar atelectasis. No edema or infiltrate.  Ectatic aorta   Electronically Signed   By: Suzy Bouchard M.D.   On: 08/28/2013 15:44   . aspirin EC  81 mg Oral Daily  . docusate sodium  100 mg Oral BID  . minoxidil  5 mg Oral Daily  . nebivolol  20 mg Oral Daily    BMET    Component Value Date/Time   NA 142 08/30/2013 0550  K 4.3 08/30/2013 0550   CL 107 08/30/2013 0550   CO2 21 08/30/2013 0550   GLUCOSE 102* 08/30/2013 0550   BUN 57* 08/30/2013 0550   CREATININE 4.56* 08/30/2013 0550   CALCIUM 9.6 08/30/2013 0550   GFRNONAA 10* 08/30/2013 0550   GFRAA 11* 08/30/2013 0550   CBC    Component Value Date/Time   WBC 6.2 08/30/2013 0550   RBC 3.72* 08/30/2013 0550   RBC 3.72* 08/30/2013 0550   HGB 9.7* 08/30/2013 0550   HCT 29.3* 08/30/2013 0550   PLT 242 08/30/2013 0550   MCV 78.8 08/30/2013 0550   MCH 26.1 08/30/2013 0550   MCHC 33.1 08/30/2013 0550   RDW 15.4 08/30/2013 0550   LYMPHSABS 1.4 02/16/2013 1139   MONOABS 0.4 02/16/2013 1139   EOSABS 0.3 02/16/2013 1139   BASOSABS 0.1 02/16/2013 1139     Assessment/Plan: 1. 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  Her SCr has remained roughly unchanged overnight.  SPEP, UPEP and renal U/S  pending. She will need evaluation by VVS for vascular access placement and education about RRT. 2. Hypertensive Urgency: Her HTN remains well controlled here on Bystolic 20mg  and Minoxidil 5mg .  Her Benicar/HCTZ and Spironolactone are held.  Renin/Aldosterone, Catecholamines, Cortisol are pending 3. Anemia in CKD: Anemia panel pending.  May need Aranesp going forward.  Lucious Groves 08/29/2013, 1:59 PM  Internal Medicine PGY-2  Pager [phone] I have seen and examined this patient and agree with plan as outlined by Dr. Heber [place].  Excellent BP control on only 2 medications which suggests nonadherence to outpt regimen.  Unfortunately she has had progressive CKD stage 4 due to hypertensive nephrosclerosis (with 6 month h/o accelerated HTN and progression of CKD).  Discussed RRT options including HD and PD as well as renal transplant (although hypertensive cardiomyopathy may be an issue).  Will need to have pt view educational videos regarding RRT and consult VVS to evaluate for AVF placement.  Will need close f/u with her PCP/Cardiologist/Nephrology after discharge.  Will also likely require outpt EPO but awaiting iron stores and SPEP/UPEP results. Kalib Bhagat A,MD 08/30/2013 10:37 AM

## 2013-08-31 DIAGNOSIS — N184 Chronic kidney disease, stage 4 (severe): Secondary | ICD-10-CM

## 2013-08-31 LAB — BASIC METABOLIC PANEL
Anion gap: 15 (ref 5–15)
BUN: 59 mg/dL — ABNORMAL HIGH (ref 6–23)
CHLORIDE: 109 meq/L (ref 96–112)
CO2: 20 meq/L (ref 19–32)
CREATININE: 4.47 mg/dL — AB (ref 0.50–1.10)
Calcium: 9.2 mg/dL (ref 8.4–10.5)
GFR calc non Af Amer: 10 mL/min — ABNORMAL LOW (ref >90)
GFR, EST AFRICAN AMERICAN: 11 mL/min — AB (ref >90)
Glucose, Bld: 94 mg/dL (ref 70–99)
POTASSIUM: 4.4 meq/L (ref 3.7–5.3)
SODIUM: 144 meq/L (ref 137–147)

## 2013-08-31 MED ORDER — CALCITRIOL 0.25 MCG PO CAPS
0.2500 ug | ORAL_CAPSULE | Freq: Every day | ORAL | Status: DC
  Administered 2013-08-31 – 2013-09-01 (×2): 0.25 ug via ORAL
  Filled 2013-08-31 (×2): qty 1

## 2013-08-31 MED ORDER — DARBEPOETIN ALFA-POLYSORBATE 40 MCG/0.4ML IJ SOLN
40.0000 ug | Freq: Once | INTRAMUSCULAR | Status: AC
  Administered 2013-08-31: 40 ug via SUBCUTANEOUS
  Filled 2013-08-31: qty 0.4

## 2013-08-31 MED ORDER — DARBEPOETIN ALFA-POLYSORBATE 40 MCG/0.4ML IJ SOLN: 40.0000 ug | INTRAMUSCULAR | Status: DC

## 2013-08-31 NOTE — Consult Note (Addendum)
[facility]  Reason for Consult:  Dialysis access Referring Physician:  Marval Regal MRN #:  [id number]  History of Present Illness: This is a 63 y.o. female with past medical history of uncontrolled hypertension who presented to hospital with complaints of headache and shortness of breath. She was found to be in acute on chronic renal failure in the ED with a creatinine of 5. She is unaware of previous kidney issues. She is not yet on dialysis. We have been consulted for dialysis access placement. She is right handed. She has not had previous access procedures.   She has hypertension managed with four antihypertensive medications. She is not diabetic. She is not on an aspirin. She does not have hypercholesterolemia.   On ROS, she denies any current shortness of breath or chest pain, weakness in her arms and legs. She still produces urine. All other systems negative.  Past Medical History  Diagnosis Date  . Hypertension    Past Surgical History  Procedure Laterality Date  . Cesarean section    . Carpal tunnel release    . Total abdominal hysterectomy w/ bilateral salpingoophorectomy      No Known Allergies  Prior to Admission medications   Medication Sig Start Date End Date Taking? Authorizing Provider  minoxidil (LONITEN) 10 MG tablet Take 0.5 tablets (5 mg total) by mouth daily. 08/25/13  Yes Candee Furbish, MD  nebivolol (BYSTOLIC) 10 MG tablet Take 2 tablets (20 mg total) by mouth daily. 04/07/13  Yes Candee Furbish, MD  olmesartan-hydrochlorothiazide (BENICAR HCT) 40-25 MG per tablet Take 1 tablet by mouth daily. 03/21/13  Yes Candee Furbish, MD  spironolactone (ALDACTONE) 25 MG tablet Take 1 tablet (25 mg total) by mouth daily. 03/14/13  Yes Fay Records, MD    History   Social History  . Marital Status: Divorced    Spouse Name: N/A    Number of Children: N/A  . Years of Education: N/A   Occupational History  . Not on file.   Social History Main  Topics  . Smoking status: Never Smoker   . Smokeless tobacco: Not on file  . Alcohol Use: No  . Drug Use: No  . Sexual Activity: Not on file   Other Topics Concern  . Not on file   Social History Narrative  . No narrative on file     Family History  Problem Relation Age of Onset  . Stroke Father   . Heart attack Father   . Hypertension Father   . Diabetes Father     Physical Examination  Filed Vitals:   08/31/13 0907  BP: 135/79  Pulse:   Temp:   Resp:    Body mass index is 26.51 kg/(m^2).  General:  WDWN pleasant female in NAD Gait: Not observed HENT: WNL, normocephalic Pulmonary: normal non-labored breathing, without Rales, rhonchi,  wheezing Cardiac: regular, without  Murmurs, rubs or gallops; without carotid bruits Abdomen: soft, NT/ND, no masses Skin: without rashes, without ulcers  Vascular Exam/Pulses:  Right Left  Radial 2+ (normal) 2+ (normal)  Ulnar 1+ (weak) 1+ (weak)  DP 2+ (normal) 2+ (normal)  PT 2+ (normal) 2+ (normal)   Extremities: without ischemic changes, without Gangrene , without cellulitis; without open wounds;  Musculoskeletal: no muscle wasting or atrophy  Neurologic: A&O X 3; Appropriate Affect ; SENSATION: normal; MOTOR FUNCTION:  moving all extremities equally. Speech is fluent/normal Psychiatric:  judgment intact, Mood & affect appropriate for pt's clinical situation   CBC  Component Value Date/Time   WBC 6.2 08/30/2013 0550   RBC 3.72* 08/30/2013 0550   RBC 3.72* 08/30/2013 0550   HGB 9.7* 08/30/2013 0550   HCT 29.3* 08/30/2013 0550   PLT 242 08/30/2013 0550   MCV 78.8 08/30/2013 0550   MCH 26.1 08/30/2013 0550   MCHC 33.1 08/30/2013 0550   RDW 15.4 08/30/2013 0550   LYMPHSABS 1.4 02/16/2013 1139   MONOABS 0.4 02/16/2013 1139   EOSABS 0.3 02/16/2013 1139   BASOSABS 0.1 02/16/2013 1139    BMET    Component Value Date/Time   NA 144 08/31/2013 0545   K 4.4 08/31/2013 0545   CL 109 08/31/2013 0545   CO2 20 08/31/2013 0545   GLUCOSE  94 08/31/2013 0545   BUN 59* 08/31/2013 0545   CREATININE 4.47* 08/31/2013 0545   CALCIUM 9.2 08/31/2013 0545   GFRNONAA 10* 08/31/2013 0545   GFRAA 11* 08/31/2013 0545    COAGS: No results found for this basename: INR, PROTIME    Statin:  No. Beta Blocker:  Yes.   Aspirin:  No. ACEI:  No. ARB:  Yes.   Other antiplatelets/anticoagulants:  No.   ASSESSMENT: This is a 63 y.o. female with acute on chronic kidney disease stage IV versus progression of chronic kidney disease due to accelerated hypertension, not yet requiring hemodialysis  PLAN: -Upper extremity vein mapping pending. -Explained the nature of access surgery, benefits, and risks at length with the patient.  -Will have patient follow up as an outpatient to schedule access surgery.     Virgina Jock, PA-C [facility] Office: [phone] Pager: [phone]   I have examined the patient, reviewed and agree with above. Patient has very small surface veins. She does have an IV in her left forearm. Vein mapping is pending. I discussed the nature access for hemodialysis to include hemodialysis catheter, AV fistula an AV graft. We'll make recommendations pending vein map. Patient does not need to be held from discharge from vascular standpoint. Can make final recommendations as an outpatient if she is unable to give her vein map prior to discharge.  Bertie Mcconathy, MD 08/31/2013 4:16 PM

## 2013-08-31 NOTE — Progress Notes (Signed)
S: Patient reports no current complaints.  Denies any HA, Chest pain, change in vision.  Reports she watched the dialysis educational videos. O:BP 135/79  Pulse 75  Temp(Src) 98.6 F (37 C) (Oral)  Resp 16  Ht 5\' 2"  (1.575 m)  Wt 145 lb (65.772 kg)  BMI 26.51 kg/m2  SpO2 99%  Intake/Output Summary (Last 24 hours) at 08/31/13 0924 Last data filed at 08/30/13 1430  Gross per 24 hour  Intake    100 ml  Output    200 ml  Net   -100 ml   Intake/Output: I/O last 3 completed shifts: In: 250 [P.O.:250] Out: 400 [Urine:400]  Intake/Output this shift:    Weight change:  OVF:IEPPIRJ in bed comfortably CVS:RRR 2/6 systolic murmur Resp:CTAB no w/r/r JOA:[id number] Bowel sounds, nontender, non distended Ext:no edema   Recent Labs Lab 08/28/13 1210 08/28/13 2020 08/29/13 0520 08/30/13 0550 08/31/13 0545  NA 144  --  140 142 144  K 3.3*  --  4.3 4.3 4.4  CL 102  --  105 107 109  CO2 26  --  22 21 20   GLUCOSE 107*  --  93 102* 94  BUN 72*  --  61* 57* 59*  CREATININE 5.00* 4.37* 4.53* 4.56* 4.47*  ALBUMIN  --   --   --  4.1  --   CALCIUM 9.4  --  9.3 9.6 9.2  PHOS  --   --   --  3.8  --   AST  --   --   --  16  --   ALT  --   --   --  16  --    Liver Function Tests:  Recent Labs Lab 08/30/13 0550  AST 16  ALT 16  ALKPHOS 115  BILITOT 0.3  PROT 7.5  ALBUMIN 4.1   No results found for this basename: LIPASE, AMYLASE,  in the last 168 hours No results found for this basename: AMMONIA,  in the last 168 hours CBC:  Recent Labs Lab 08/28/13 1210 08/28/13 2020 08/29/13 0520 08/30/13 0550  WBC 5.9 5.9 6.6 6.2  HGB 10.6* 10.1* 9.4* 9.7*  HCT 31.6* 30.1* 28.6* 29.3*  MCV 79.2 79.8 79.0 78.8  PLT 237 194 212 242   Cardiac Enzymes:  Recent Labs Lab 08/28/13 1210  TROPONINI <0.30   CBG: No results found for this basename: GLUCAP,  in the last 168 hours  Iron Studies:   Recent Labs  08/30/13 0550  IRON 65  TIBC 250  FERRITIN 144    Studies/Results: US Renal  08/30/2013   CLINICAL DATA:  Acute on chronic renal failure, history hypertension, diabetes  EXAM: RENAL/URINARY TRACT ULTRASOUND COMPLETE  COMPARISON:  10/21/2011  FINDINGS: Right Kidney:  Length: 10.3 cm. Normal cortical thickness. Increased cortical echogenicity. No mass, hydronephrosis or shadowing calcification. No perinephric fluid.  Left Kidney:  Length: 11.0 cm. Normal cortical thickness. Increased cortical echogenicity. No mass, hydronephrosis or shadowing calcification. No perinephric fluid.  Bladder:  Partially distended, grossly unremarkable.  IMPRESSION: Medical renal disease changes of both kidneys without evidence of renal mass or hydronephrosis.   Electronically Signed   By: Lavonia Dana M.D.   On: 08/30/2013 10:24   . aspirin EC  81 mg Oral Daily  . docusate sodium  100 mg Oral BID  . minoxidil  5 mg Oral Daily  . nebivolol  20 mg Oral Daily    BMET    Component Value Date/Time   NA 144 08/31/2013 0545  K 4.4 08/31/2013 0545   CL 109 08/31/2013 0545   CO2 20 08/31/2013 0545   GLUCOSE 94 08/31/2013 0545   BUN 59* 08/31/2013 0545   CREATININE 4.47* 08/31/2013 0545   CALCIUM 9.2 08/31/2013 0545   GFRNONAA 10* 08/31/2013 0545   GFRAA 11* 08/31/2013 0545   CBC    Component Value Date/Time   WBC 6.2 08/30/2013 0550   RBC 3.72* 08/30/2013 0550   RBC 3.72* 08/30/2013 0550   HGB 9.7* 08/30/2013 0550   HCT 29.3* 08/30/2013 0550   PLT 242 08/30/2013 0550   MCV 78.8 08/30/2013 0550   MCH 26.1 08/30/2013 0550   MCHC 33.1 08/30/2013 0550   RDW 15.4 08/30/2013 0550   LYMPHSABS 1.4 02/16/2013 1139   MONOABS 0.4 02/16/2013 1139   EOSABS 0.3 02/16/2013 1139   BASOSABS 0.1 02/16/2013 1139     Assessment/Plan: 1. Acute on Chronic Kidney Disease stage IV (Baseline SCr 2.2-2.3) versus progression of Chronic Kidney disease due to accelerated HTN.  SPEP, UPEP and pending. She will need evaluation by [facility] for vascular access placement and education about RRT. 1. Have consulted  [facility] this morning to evaluate for vascular access as she has advanced CKD at baseline and will need preparation for HD in the future which can be done as an outpt if she is going to be discharged tomorrow.  2. Hypertensive Urgency: Her HTN remains well controlled here on Bystolic 20mg  and Minoxidil 5mg , although is slightly higher this AM at 149/93  Her Benicar/HCTZ and Spironolactone are held.   1. Renin/Aldosterone, Catecholamines are pending. 2. Normal cortisol and TSH 3. May need to resume other agents if BP cont to increase such as verapamil sustained release 3. Anemia in CKD: Iron studies wnl. Awaiting SPEP and UPEP results.   1. Start aranesp and cont as an outpt 4. Dispo- hopeful discharge tomorrow if BP and Scr remain stable.  Will also need to f/u on results of SPEP/UPEP, renin/aldo, and catecholamines, and vascular access  Lucious Groves 08/29/2013, 1:59 PM  Internal Medicine PGY-2  Pager [phone] I have seen and examined this patient and agree with plan as outlined by Dr. Heber  except for additions above. Tarin Johndrow A,MD 08/31/2013 10:27 AM

## 2013-08-31 NOTE — Progress Notes (Signed)
[facility] PROGRESS NOTE  Briana Brock GBT:[id number] DOB: 1950/04/27 DOA: 08/28/2013 PCP: Stacy Gardner, PA-C  D/w Dr. Marval Regal  Assessment/Plan: Principal Problem:   Acute on chronic renal failure: likely progression of CKD. Plans per nephrology Active Problems:   Hypokalemia repleted   Essential hypertension, benign controlled currently, but increasing today. May needs adjustment of meds per nephrology   Normocytic Anemia: ferritin, folate, B12 ok. Likely secondary to CKD   Diastolic dysfunction: proBNP high, but no clinical signs of fluid overload. Likely due to renal failure  Home tomorrow if ok with consultants  Code Status:  full Family Communication:   Disposition Plan:  home  Consultants:  Nephrology  [facility]  Procedures:     Antibiotics:    HPI/Subjective: No complaints. No weakness, dyspnea or CP. No edema  Objective: Filed Vitals:   08/31/13 1435  BP: 150/86  Pulse: 65  Temp: 98.2 F (36.8 C)  Resp: 18    Intake/Output Summary (Last 24 hours) at 08/31/13 1457 Last data filed at 08/31/13 1300  Gross per 24 hour  Intake      0 ml  Output    500 ml  Net   -500 ml   Filed Weights   08/28/13 1146  Weight: 65.772 kg (145 lb)    Exam:   General:  Comfortable in chair  Cardiovascular: RRR without NGR  Respiratory: CTA without WRR  Abdomen: S, NT, ND  Ext: no CCE Psych: flat affect. Appears more depressed  Basic Metabolic Panel:  Recent Labs Lab 08/28/13 1210 08/28/13 2020 08/29/13 0520 08/30/13 0550 08/31/13 0545  NA 144  --  140 142 144  K 3.3*  --  4.3 4.3 4.4  CL 102  --  105 107 109  CO2 26  --  22 21 20   GLUCOSE 107*  --  93 102* 94  BUN 72*  --  61* 57* 59*  CREATININE 5.00* 4.37* 4.53* 4.56* 4.47*  CALCIUM 9.4  --  9.3 9.6 9.2  MG  --   --  1.9 1.9  --   PHOS  --   --   --  3.8  --    Liver Function Tests:  Recent Labs Lab 08/30/13 0550  AST 16  ALT 16  ALKPHOS 115  BILITOT 0.3  PROT 7.5   ALBUMIN 4.1   No results found for this basename: LIPASE, AMYLASE,  in the last 168 hours No results found for this basename: AMMONIA,  in the last 168 hours CBC:  Recent Labs Lab 08/28/13 1210 08/28/13 2020 08/29/13 0520 08/30/13 0550  WBC 5.9 5.9 6.6 6.2  HGB 10.6* 10.1* 9.4* 9.7*  HCT 31.6* 30.1* 28.6* 29.3*  MCV 79.2 79.8 79.0 78.8  PLT 237 194 212 242   Cardiac Enzymes:  Recent Labs Lab 08/28/13 1210  TROPONINI <0.30   BNP (last 3 results)  Recent Labs  02/16/13 1139 08/28/13 1210  PROBNP 745.0* 8784.0*   CBG: No results found for this basename: GLUCAP,  in the last 168 hours  No results found for this or any previous visit (from the past 240 hour(s)).   Studies: US Renal  08/30/2013   CLINICAL DATA:  Acute on chronic renal failure, history hypertension, diabetes  EXAM: RENAL/URINARY TRACT ULTRASOUND COMPLETE  COMPARISON:  10/21/2011  FINDINGS: Right Kidney:  Length: 10.3 cm. Normal cortical thickness. Increased cortical echogenicity. No mass, hydronephrosis or shadowing calcification. No perinephric fluid.  Left Kidney:  Length: 11.0 cm. Normal cortical thickness. Increased cortical  echogenicity. No mass, hydronephrosis or shadowing calcification. No perinephric fluid.  Bladder:  Partially distended, grossly unremarkable.  IMPRESSION: Medical renal disease changes of both kidneys without evidence of renal mass or hydronephrosis.   Electronically Signed   By: Lavonia Dana M.D.   On: 08/30/2013 10:24    Scheduled Meds: . aspirin EC  81 mg Oral Daily  . calcitRIOL  0.25 mcg Oral Daily  . docusate sodium  100 mg Oral BID  . minoxidil  5 mg Oral Daily  . nebivolol  20 mg Oral Daily   Continuous Infusions:    Time spent: 35 minutes  [facility] Pager [phone]. If 7PM-7AM, please contact night-coverage at [email or web address], password [email or web address] 08/31/2013, 2:57 PM  LOS: 3 days

## 2013-09-01 DIAGNOSIS — Z01810 Encounter for preprocedural cardiovascular examination: Secondary | ICD-10-CM

## 2013-09-01 LAB — PROTEIN ELECTROPHORESIS, SERUM
ALPHA-1-GLOBULIN: 4.5 % (ref 2.9–4.9)
ALPHA-2-GLOBULIN: 7.8 % (ref 7.1–11.8)
Albumin ELP: 58.6 % (ref 55.8–66.1)
Beta 2: 3.4 % (ref 3.2–6.5)
Beta Globulin: 5.2 % (ref 4.7–7.2)
Gamma Globulin: 20.5 % — ABNORMAL HIGH (ref 11.1–18.8)
M-Spike, %: 0.31 g/dL
TOTAL PROTEIN ELP: 6.8 g/dL (ref 6.0–8.3)

## 2013-09-01 LAB — IMMUNOFIXATION ELECTROPHORESIS
IgA: 163 mg/dL (ref 69–380)
IgG (Immunoglobin G), Serum: 1630 mg/dL — ABNORMAL HIGH (ref 690–1700)
IgM, Serum: 76 mg/dL (ref 52–322)
TOTAL PROTEIN ELP: 7 g/dL (ref 6.0–8.3)

## 2013-09-01 LAB — BASIC METABOLIC PANEL
Anion gap: 15 (ref 5–15)
BUN: 59 mg/dL — ABNORMAL HIGH (ref 6–23)
CO2: 19 meq/L (ref 19–32)
Calcium: 9.4 mg/dL (ref 8.4–10.5)
Chloride: 108 mEq/L (ref 96–112)
Creatinine, Ser: 4.47 mg/dL — ABNORMAL HIGH (ref 0.50–1.10)
GFR calc Af Amer: 11 mL/min — ABNORMAL LOW (ref >90)
GFR calc non Af Amer: 10 mL/min — ABNORMAL LOW (ref >90)
Glucose, Bld: 85 mg/dL (ref 70–99)
POTASSIUM: 4.6 meq/L (ref 3.7–5.3)
SODIUM: 142 meq/L (ref 137–147)

## 2013-09-01 LAB — UIFE/LIGHT CHAINS/TP QN, 24-HR UR
ALBUMIN, U: DETECTED
Alpha 1, Urine: DETECTED — AB
Alpha 2, Urine: DETECTED — AB
Beta, Urine: DETECTED — AB
Free Kappa Lt Chains,Ur: 32.6 mg/dL — ABNORMAL HIGH (ref 0.14–2.42)
Free Kappa/Lambda Ratio: 4.66 ratio (ref 2.04–10.37)
Free Lambda Lt Chains,Ur: 6.99 mg/dL — ABNORMAL HIGH (ref 0.02–0.67)
GAMMA UR: DETECTED — AB
Total Protein, Urine: 56.7 mg/dL

## 2013-09-01 LAB — CATECHOLAMINES, FRACTIONATED, PLASMA
Norepinephrine: 1476 pg/mL
TOTAL CATECHOLAMINES(NOR+ EPI): 1476 pg/mL

## 2013-09-01 MED ORDER — CALCITRIOL 0.25 MCG PO CAPS
0.2500 ug | ORAL_CAPSULE | Freq: Every day | ORAL | Status: AC
Start: 2013-09-01 — End: ?

## 2013-09-01 NOTE — Progress Notes (Signed)
Patient discharge teaching given, including activity, diet, follow-up appoints, and medications. Patient verbalized understanding of all discharge instructions. IV access was d/c'd. Vitals are stable. Skin is intact except as charted in most recent assessments. Pt to be escorted out by NT, to be driven home by family. 

## 2013-09-01 NOTE — Progress Notes (Signed)
S: Patient feeling well this morning with no complaints. O:BP 134/74  Pulse 78  Temp(Src) 98.5 F (36.9 C) (Oral)  Resp 18  Ht 5\' 2"  (1.575 m)  Wt 145 lb (65.772 kg)  BMI 26.51 kg/m2  SpO2 97%  Intake/Output Summary (Last 24 hours) at 09/01/13 0920 Last data filed at 08/31/13 2230  Gross per 24 hour  Intake    120 ml  Output    750 ml  Net   -630 ml   Intake/Output: I/O last 3 completed shifts: In: 120 [P.O.:120] Out: 750 [Urine:750]  Intake/Output this shift:    Weight change:  XBL:[id number] in bed comfortably CVS:RRR 2/6 systolic murmur Resp:CTAB no w/r/r QZR:[id number] Bowel sounds, nontender, non distended Ext:no pedal edema   Recent Labs Lab 08/28/13 1210 08/28/13 2020 08/29/13 0520 08/30/13 0550 08/31/13 0545  NA 144  --  140 142 144  K 3.3*  --  4.3 4.3 4.4  CL 102  --  105 107 109  CO2 26  --  22 21 20   GLUCOSE 107*  --  93 102* 94  BUN 72*  --  61* 57* 59*  CREATININE 5.00* 4.37* 4.53* 4.56* 4.47*  ALBUMIN  --   --   --  4.1  --   CALCIUM 9.4  --  9.3 9.6 9.2  PHOS  --   --   --  3.8  --   AST  --   --   --  16  --   ALT  --   --   --  16  --    Liver Function Tests:  Recent Labs Lab 08/30/13 0550  AST 16  ALT 16  ALKPHOS 115  BILITOT 0.3  PROT 7.5  ALBUMIN 4.1   No results found for this basename: LIPASE, AMYLASE,  in the last 168 hours No results found for this basename: AMMONIA,  in the last 168 hours CBC:  Recent Labs Lab 08/28/13 1210 08/28/13 2020 08/29/13 0520 08/30/13 0550  WBC 5.9 5.9 6.6 6.2  HGB 10.6* 10.1* 9.4* 9.7*  HCT 31.6* 30.1* 28.6* 29.3*  MCV 79.2 79.8 79.0 78.8  PLT 237 194 212 242   Cardiac Enzymes:  Recent Labs Lab 08/28/13 1210  TROPONINI <0.30   CBG: No results found for this basename: GLUCAP,  in the last 168 hours  Iron Studies:   Recent Labs  08/30/13 0550  IRON 65  TIBC 250  FERRITIN 144   Studies/Results: US Renal  08/30/2013   CLINICAL DATA:  Acute on chronic renal failure,  history hypertension, diabetes  EXAM: RENAL/URINARY TRACT ULTRASOUND COMPLETE  COMPARISON:  10/21/2011  FINDINGS: Right Kidney:  Length: 10.3 cm. Normal cortical thickness. Increased cortical echogenicity. No mass, hydronephrosis or shadowing calcification. No perinephric fluid.  Left Kidney:  Length: 11.0 cm. Normal cortical thickness. Increased cortical echogenicity. No mass, hydronephrosis or shadowing calcification. No perinephric fluid.  Bladder:  Partially distended, grossly unremarkable.  IMPRESSION: Medical renal disease changes of both kidneys without evidence of renal mass or hydronephrosis.   Electronically Signed   By: Lavonia Dana M.D.   On: 08/30/2013 10:24   . aspirin EC  81 mg Oral Daily  . calcitRIOL  0.25 mcg Oral Daily  . docusate sodium  100 mg Oral BID  . minoxidil  5 mg Oral Daily  . nebivolol  20 mg Oral Daily    BMET    Component Value Date/Time   NA 144 08/31/2013 0545   K 4.4 08/31/2013  0545   CL 109 08/31/2013 0545   CO2 20 08/31/2013 0545   GLUCOSE 94 08/31/2013 0545   BUN 59* 08/31/2013 0545   CREATININE 4.47* 08/31/2013 0545   CALCIUM 9.2 08/31/2013 0545   GFRNONAA 10* 08/31/2013 0545   GFRAA 11* 08/31/2013 0545   CBC    Component Value Date/Time   WBC 6.2 08/30/2013 0550   RBC 3.72* 08/30/2013 0550   RBC 3.72* 08/30/2013 0550   HGB 9.7* 08/30/2013 0550   HCT 29.3* 08/30/2013 0550   PLT 242 08/30/2013 0550   MCV 78.8 08/30/2013 0550   MCH 26.1 08/30/2013 0550   MCHC 33.1 08/30/2013 0550   RDW 15.4 08/30/2013 0550   LYMPHSABS 1.4 02/16/2013 1139   MONOABS 0.4 02/16/2013 1139   EOSABS 0.3 02/16/2013 1139   BASOSABS 0.1 02/16/2013 1139     Assessment/Plan: 1. Acute on Chronic Kidney Disease stage IV (Baseline SCr 2.2-2.3) versus progression of Chronic Kidney disease due to accelerated HTN.  SPEP, UPEP and still pending. She has been evaluated by VVS and vein mapping have been ordered she will follow up with VVS after discharge, if vein mapping not done can be done as  outpatient. 2. Hypertensive Urgency: Her HTN remains well controlled here on Bystolic 20mg  and Minoxidil 5mg , patient monitored overnight with a few mildly high readings but this morning is again well controlled. 1. Renin/Aldosterone, Catecholamines are pending. 2. Normal cortisol and TSH 3. If additional agent needed would recommend Verapamil sustained release 3. Anemia in CKD: Iron studies wnl. Awaiting SPEP and UPEP results.  Patient started on Aranesp 16mcg Q2weeks. Will follow up as outpatient. 4. Secondary hyperparathyroidism due to CKD: PTH 436. Patient started on Calcitriol 0.25mg  daily. 5. Dispo: discharge today: will follow need to f/u results of SPEP/UPEP, renin/aldo and catecholamines. And need to follow up with VVS for access.  Lucious Groves 08/29/2013, 1:59 PM  Internal Medicine PGY-2  Pager [phone]  I have seen and examined this patient and agree with plan as outlined by Dr. Heber [place].  Continue with bystolic and minoxidil for now.  Will fu with VVS for access placement.  Will cont with education regarding RRT and renal transplantation.  She will follow up with me in outpt clinic on 09/07/13 at 11:00am at [place]: [address], [place], [place] [address] [phone]. Marquise Wicke A,MD 09/01/2013 10:19 AM

## 2013-09-01 NOTE — Progress Notes (Signed)
VASCULAR LAB PRELIMINARY  PRELIMINARY  PRELIMINARY  PRELIMINARY  Right  Upper Extremity Vein Map    Cephalic  Segment Diameter Depth Comment  1. Axilla 3.72mm mm   2. Mid upper arm 2.30mm mm   3. Above AC 2.83mm mm   4. In [facility] 4.35mm mm   5. Below AC 1.3mm mm branch  6. Mid forearm 1.19mm mm   7. Wrist mm mm    mm mm    mm mm    mm mm    Basilic  Segment Diameter Depth Comment  1. Axilla 4.29mm mm   2. Mid upper arm 1.21mm mm   3. Above AC 2.69mm mm   4. In AC 2.13mm mm   5. Below AC mm mm Too small  6. Mid forearm mm mm   7. Wrist mm mm    mm mm    mm mm    mm mm       Left Upper Extremity Vein Map    Cephalic  Segment Diameter Depth Comment  1. Axilla 2.31mm mm   2. Mid upper arm 2.12mm mm   3. Above AC 1.71mm mm   4. In [facility] 3.21mm mm   5. Below AC 2.53mm mm branch  6. Mid forearm mm mm   7. Wrist mm mm    mm mm    mm mm    mm mm    Basilic  Segment Diameter Depth Comment  1. Axilla 2.34mm mm   2. Mid upper arm 2.28mm mm   3. Above AC 1.30mm mm   4. In [facility] 1.47mm mm   5. Below AC 1.64mm mm   6. Mid forearm mm mm   7. Wrist mm mm    mm mm    mm mm    mm mm      Landry Mellow, RDMS, RVT  09/01/2013, 11:51 AM

## 2013-09-01 NOTE — Discharge Summary (Addendum)
Physician Discharge Summary  Briana Brock YQM:[id number] DOB: [birth year] DOA: 08/28/2013  PCP: Stacy Gardner, PA-C  Admit date: 08/28/2013 Discharge date: 09/01/2013  Time spent: greater than 30 min  Recommendations for Outpatient Follow-up:  To follow up with [facility] for vein mapping/dialysis access  Discharge Diagnoses:  Principal Problem: worsening renal failure, likely progression of CKD Active Problems:   Dyspnea   Hypokalemia   Essential hypertension, benign   Renal Anemia   Diastolic dysfunction  Discharge Condition: stable  Filed Weights   08/28/13 1146  Weight: 65.772 kg (145 lb)    History of present illness:  63 y.o. pleasant female with medical history of glaucoma and hypertension uncontrolled, was watching TV this afternoon when she started feeling generalized body aches, headache and dizziness, she also had some shortness of breath, went to the ED where she was found to be in acute renal failure. On Friday her ophthalmologist made a same day appointment with the cardiologist after she was found to have very high blood pressure during a routine eye exam/glaucoma follow-up, there was a concern for uncontrolled hypertension, blood pressure in the cardiologist office was 240/160 per patient, she was given 2 doses of clonidine, and sent home to followup in one week. She is currently on Benicar recently increased to 40-25 mg tablet by mouth daily, spironolactone 25 mg tablet by mouth daily, Bystolic 20 mg tablet by mouth daily and minoxidil5 mg tablet by mouth daily was added on Friday. Patient also reports headaches associated with her elevated blood pressure. She has had increasing shortness of breath especially during showers in the mornings, she can only walk a block before stopping to rest, she denies having paroxysmal nocturnal dyspnea, denies orthopnea. Her last echocardiogram was February 2015 which showed left ventricular ejection fraction of 60-65%, severe asymmetric  hypertrophy of the septum, normal wall motion. Her EKG in February also showed LVH with T wave inversion in V5. Patient has not had a cath. Patient has 2 grown up daughters, she lives at home alone, she works as a Network engineer at [employer]. Her activities of daily living are intact.  Creatinine in ED 5.0. In March, 2014, creatinine was 2.3   Hospital Course:  Given saline infusion with only slight decrease in creatinine to about 4.5. Renal US showed no hydronephrosis, but did show medical renal disease.  ARB, thiazide and spironolactone stopped. Nephrology consulted. UPEP, SPEP, catecholamines ordered and are pending at the time of discharge.  Blood pressure has remained controlled on minoxidil and nebivolol alone.  Calcitriol started.  Will complete workup and follow up labs as outpatient  Procedures:  none  Consultations:  Nephrology  Vascular surgery  Discharge Exam: Filed Vitals:   09/01/13 0634  BP: 134/74  Pulse: 78  Temp: 98.5 F (36.9 C)  Resp: 18    General: flate affect Cardiovascular: RRR Respiratory: CTA Ext no CCE  Discharge Instructions   Activity as tolerated - No restrictions    Complete by:  As directed      Diet - low sodium heart healthy    Complete by:  As directed      Diet - low sodium heart healthy    Complete by:  As directed      Increase activity slowly    Complete by:  As directed             Medication List    STOP taking these medications       olmesartan-hydrochlorothiazide 40-25 MG per tablet  Commonly  known as:  BENICAR HCT     spironolactone 25 MG tablet  Commonly known as:  ALDACTONE      TAKE these medications       calcitRIOL 0.25 MCG capsule  Commonly known as:  ROCALTROL  Take 1 capsule (0.25 mcg total) by mouth daily.     minoxidil 10 MG tablet  Commonly known as:  LONITEN  Take 0.5 tablets (5 mg total) by mouth daily.     nebivolol 10 MG tablet  Commonly known as:  BYSTOLIC  Take 2 tablets (20 mg total) by  mouth daily.       No Known Allergies     Follow-up Information   Follow up with EARLY, TODD, MD. (office will call you to make an appointment (sent))    Specialty:  Vascular Surgery   Contact information:   [address] [place] [address] [phone]       Follow up with Donetta Potts, MD On 09/07/2013. (11 am)    Specialty:  Nephrology   Contact information:   [place] [address] [phone]        The results of significant diagnostics from this hospitalization (including imaging, microbiology, ancillary and laboratory) are listed below for reference.    Significant Diagnostic Studies: X-ray Chest Pa And Lateral   08/29/2013   CLINICAL DATA:  Congestive heart failure.  EXAM: CHEST  2 VIEW  COMPARISON:  08/28/2013.  FINDINGS: Trachea is midline. Heart is enlarged. Thoracic aorta is tortuous. Subsegmental atelectasis or scarring in the lingula. There may be subtle bibasilar interstitial prominence and indistinctness. No pleural fluid.  IMPRESSION: 1. Question trace dependent pulmonary edema. 2. Marked aortic tortuosity.   Electronically Signed   By: Lorin Picket M.D.   On: 08/29/2013 07:12   Dg Chest 2 View  08/28/2013   CLINICAL DATA:  Nausea, dizziness, hypertension  EXAM: CHEST  2 VIEW  COMPARISON:  None  FINDINGS: Cardiac silhouette is enlarged. The aorta is ectatic. There is mild central venous pulmonary congestion. No effusion, infiltrate, or pneumothorax. Mild left basilar atelectasis.  IMPRESSION: Central venous congestion and mild left basilar atelectasis. No edema or infiltrate.  Ectatic aorta   Electronically Signed   By: Suzy Bouchard M.D.   On: 08/28/2013 15:44   US Renal  08/30/2013   CLINICAL DATA:  Acute on chronic renal failure, history hypertension, diabetes  EXAM: RENAL/URINARY TRACT ULTRASOUND COMPLETE  COMPARISON:  10/21/2011  FINDINGS: Right Kidney:  Length: 10.3 cm. Normal cortical thickness. Increased cortical echogenicity.  No mass, hydronephrosis or shadowing calcification. No perinephric fluid.  Left Kidney:  Length: 11.0 cm. Normal cortical thickness. Increased cortical echogenicity. No mass, hydronephrosis or shadowing calcification. No perinephric fluid.  Bladder:  Partially distended, grossly unremarkable.  IMPRESSION: Medical renal disease changes of both kidneys without evidence of renal mass or hydronephrosis.   Electronically Signed   By: Lavonia Dana M.D.   On: 08/30/2013 10:24    Microbiology: No results found for this or any previous visit (from the past 240 hour(s)).   Labs: Basic Metabolic Panel:  Recent Labs Lab 08/28/13 1210 08/28/13 2020 08/29/13 0520 08/30/13 0550 08/31/13 0545 09/01/13 0953  NA 144  --  140 142 144 142  K 3.3*  --  4.3 4.3 4.4 4.6  CL 102  --  105 107 109 108  CO2 26  --  22 21 20 19   GLUCOSE 107*  --  93 102* 94 85  BUN 72*  --  61* 57* 59* 59*  CREATININE 5.00* 4.37* 4.53* 4.56* 4.47* 4.47*  CALCIUM 9.4  --  9.3 9.6 9.2 9.4  MG  --   --  1.9 1.9  --   --   PHOS  --   --   --  3.8  --   --    Liver Function Tests:  Recent Labs Lab 08/30/13 0550  AST 16  ALT 16  ALKPHOS 115  BILITOT 0.3  PROT 7.5  ALBUMIN 4.1   No results found for this basename: LIPASE, AMYLASE,  in the last 168 hours No results found for this basename: AMMONIA,  in the last 168 hours CBC:  Recent Labs Lab 08/28/13 1210 08/28/13 2020 08/29/13 0520 08/30/13 0550  WBC 5.9 5.9 6.6 6.2  HGB 10.6* 10.1* 9.4* 9.7*  HCT 31.6* 30.1* 28.6* 29.3*  MCV 79.2 79.8 79.0 78.8  PLT 237 194 212 242   Cardiac Enzymes:  Recent Labs Lab 08/28/13 1210  TROPONINI <0.30   BNP: BNP (last 3 results)  Recent Labs  02/16/13 1139 08/28/13 1210  PROBNP 745.0* 8784.0*   CBG: No results found for this basename: GLUCAP,  in the last 168 hours     Signed:  Makyla Bye L  [facility] 09/01/2013, 11:01 AM

## 2013-09-02 ENCOUNTER — Telehealth: Payer: Self-pay | Admitting: Vascular Surgery

## 2013-09-02 ENCOUNTER — Ambulatory Visit: Payer: 59 | Admitting: Cardiology

## 2013-09-02 NOTE — Telephone Encounter (Addendum)
Message copied by Gena Fray on Fri Sep 02, 2013  9:27 AM ------      Message from: Peter Minium K      Created: Thu Sep 01, 2013  1:28 PM      Regarding: Schedule       Duplicate, FYI   Vein map was done at the hospital on 09-01-13.                   ----- Message -----         From: Sherrye Payor, RN         Sent: 09/01/2013  10:51 AM           To: Mena Goes, RN, [facility] Admin Pool      Subject: Micheline Rough; also needs appt. next week with any#            Not sure about need to schedule vein mapping based on Kim's msg.      ----- Message -----         From: Alvia Grove, PA-C         Sent: 09/01/2013   8:44 AM           To: [facility] Charge Pool            Consult for dialysis access 08/31/13.            Will need to follow-up with Dr. Donnetta Hutching next week or whoever is available to continue discussion of access. Vein mapping ordered in hospital. It may or may not be completed prior to discharge from the hospital this week.             Thanks,      Kim             ------  09/02/13: lm for pt to cb for appt info, dpm

## 2013-09-05 ENCOUNTER — Encounter: Payer: Self-pay | Admitting: Vascular Surgery

## 2013-09-06 ENCOUNTER — Encounter: Payer: 59 | Admitting: Vascular Surgery

## 2013-09-06 LAB — ALDOSTERONE + RENIN ACTIVITY W/ RATIO
ALDO / PRA Ratio: 11.5 Ratio (ref 0.9–28.9)
Aldosterone: 14 ng/dL
PRA LC/MS/MS: 1.22 ng/mL/h (ref 0.25–5.82)

## 2013-09-09 ENCOUNTER — Encounter: Payer: Self-pay | Admitting: Vascular Surgery

## 2013-09-16 ENCOUNTER — Other Ambulatory Visit: Payer: Self-pay

## 2013-09-16 ENCOUNTER — Encounter: Payer: Self-pay | Admitting: Vascular Surgery

## 2013-09-16 DIAGNOSIS — Z01810 Encounter for preprocedural cardiovascular examination: Secondary | ICD-10-CM

## 2013-09-16 DIAGNOSIS — N184 Chronic kidney disease, stage 4 (severe): Secondary | ICD-10-CM

## 2013-09-26 ENCOUNTER — Encounter: Payer: Self-pay | Admitting: Vascular Surgery

## 2013-09-27 ENCOUNTER — Ambulatory Visit: Payer: 59 | Admitting: Vascular Surgery

## 2013-09-27 ENCOUNTER — Other Ambulatory Visit (HOSPITAL_COMMUNITY): Payer: 59

## 2013-09-30 ENCOUNTER — Ambulatory Visit: Payer: 59 | Admitting: Cardiology

## 2013-12-05 ENCOUNTER — Other Ambulatory Visit: Payer: Self-pay | Admitting: Cardiology

## 2013-12-07 NOTE — Telephone Encounter (Signed)
nebivolol (BYSTOLIC) 10 MG tablet Take 2 tablets (20 mg total) by mouth daily.     Briana Furbish, MD at 08/25/2013 10:19 AM

## 2015-07-11 ENCOUNTER — Encounter (HOSPITAL_COMMUNITY): Payer: Self-pay

## 2015-07-11 ENCOUNTER — Emergency Department (HOSPITAL_COMMUNITY)
Admission: EM | Admit: 2015-07-11 | Discharge: 2015-07-11 | Disposition: A | Discharge status: Home / Self Care | Payer: Medicare Other | Attending: Emergency Medicine | Admitting: Emergency Medicine

## 2015-07-11 DIAGNOSIS — Z7982 Long term (current) use of aspirin: Secondary | ICD-10-CM | POA: Diagnosis not present

## 2015-07-11 DIAGNOSIS — N184 Chronic kidney disease, stage 4 (severe): Secondary | ICD-10-CM | POA: Insufficient documentation

## 2015-07-11 DIAGNOSIS — I129 Hypertensive chronic kidney disease with stage 1 through stage 4 chronic kidney disease, or unspecified chronic kidney disease: Secondary | ICD-10-CM | POA: Insufficient documentation

## 2015-07-11 DIAGNOSIS — Z79899 Other long term (current) drug therapy: Secondary | ICD-10-CM | POA: Diagnosis not present

## 2015-07-11 DIAGNOSIS — Z992 Dependence on renal dialysis: Secondary | ICD-10-CM | POA: Insufficient documentation

## 2015-07-11 LAB — CBC WITH DIFFERENTIAL/PLATELET
BASOS PCT: 1 %
Basophils Absolute: 0 10*3/uL (ref 0.0–0.1)
EOS ABS: 0.3 10*3/uL (ref 0.0–0.7)
Eosinophils Relative: 5 %
HCT: 34.6 % — ABNORMAL LOW (ref 36.0–46.0)
Hemoglobin: 11.2 g/dL — ABNORMAL LOW (ref 12.0–15.0)
Lymphocytes Relative: 31 %
Lymphs Abs: 1.4 10*3/uL (ref 0.7–4.0)
MCH: 26.9 pg (ref 26.0–34.0)
MCHC: 32.4 g/dL (ref 30.0–36.0)
MCV: 83.2 fL (ref 78.0–100.0)
Monocytes Absolute: 0.3 10*3/uL (ref 0.1–1.0)
Monocytes Relative: 5 %
NEUTROS ABS: 2.7 10*3/uL (ref 1.7–7.7)
Neutrophils Relative %: 58 %
Platelets: 186 10*3/uL (ref 150–400)
RBC: 4.16 MIL/uL (ref 3.87–5.11)
RDW: 14.9 % (ref 11.5–15.5)
WBC: 4.7 10*3/uL (ref 4.0–10.5)

## 2015-07-11 LAB — COMPREHENSIVE METABOLIC PANEL
ALBUMIN: 3.9 g/dL (ref 3.5–5.0)
ALK PHOS: 137 U/L — AB (ref 38–126)
ALT: 21 U/L (ref 14–54)
ANION GAP: 6 (ref 5–15)
AST: 19 U/L (ref 15–41)
BUN: 59 mg/dL — AB (ref 6–20)
CALCIUM: 9 mg/dL (ref 8.9–10.3)
CO2: 23 mmol/L (ref 22–32)
Chloride: 112 mmol/L — ABNORMAL HIGH (ref 101–111)
Creatinine, Ser: 3.35 mg/dL — ABNORMAL HIGH (ref 0.44–1.00)
GFR calc Af Amer: 16 mL/min — ABNORMAL LOW (ref >60)
GFR calc non Af Amer: 14 mL/min — ABNORMAL LOW (ref >60)
GLUCOSE: 92 mg/dL (ref 65–99)
POTASSIUM: 4.5 mmol/L (ref 3.5–5.1)
SODIUM: 141 mmol/L (ref 135–145)
TOTAL PROTEIN: 7.1 g/dL (ref 6.5–8.1)
Total Bilirubin: 0.6 mg/dL (ref 0.3–1.2)

## 2015-07-11 NOTE — ED Notes (Signed)
Patient was to have dialysis today on [place] since traveling from [place]. But the center called and states she needs labs prior to her appointment at noon

## 2015-07-11 NOTE — ED Notes (Signed)
Pt ambulated to room from waiting room. 

## 2015-07-11 NOTE — ED Provider Notes (Signed)
CSN: [id number]     Arrival date & time 07/11/15  [id number] History   First MD Initiated Contact with Patient 07/11/15 [phone]     No chief complaint on file.    (Consider location/radiation/quality/duration/timing/severity/associated sxs/prior Treatment) Patient is a 65 y.o. female presenting with general illness.  Illness Location:  Body Quality:  Dialysis needed Severity:  Mild Timing:  Constant Chronicity:  Chronic Context:  Travelling from [place], needs dialysis, last dialysis was on Friday Relieved by:  None tried Worsened by:  None tried Associated symptoms: no congestion, no cough, no fatigue and no rash     Past Medical History  Diagnosis Date  . Hypertension   . Anemia   . Chronic kidney disease     Stage IV  . Dyslipidemia   . Hyperparathyroidism ([place])   . Monoclonal paraproteinemia   . Monoclonal gammopathy of unknown significance    Past Surgical History  Procedure Laterality Date  . Cesarean section    . Carpal tunnel release    . Total abdominal hysterectomy w/ bilateral salpingoophorectomy     Family History  Problem Relation Age of Onset  . Stroke Father   . Heart attack Father   . Hypertension Father   . Diabetes Father    Social History  Substance Use Topics  . Smoking status: Never Smoker   . Smokeless tobacco: Never Used  . Alcohol Use: No   OB History    No data available     Review of Systems  Constitutional: Negative for fatigue.  HENT: Negative for congestion.   Eyes: Negative for pain, redness and itching.  Respiratory: Negative for cough.   Endocrine: Negative for polydipsia and polyuria.  Skin: Negative for rash.  All other systems reviewed and are negative.     Allergies  Review of patient's allergies indicates no known allergies.  Home Medications   Prior to Admission medications   Medication Sig Start Date End Date Taking? Authorizing Provider  aspirin EC 81 MG tablet Take 81 mg by mouth daily.   Yes Historical Provider, MD   folic acid-vitamin b complex-vitamin c-selenium-zinc (DIALYVITE) 3 MG TABS tablet Take 1 tablet by mouth daily.   Yes Historical Provider, MD  losartan (COZAAR) 50 MG tablet Take 100 mg by mouth daily.   Yes Historical Provider, MD  metoprolol succinate (TOPROL-XL) 100 MG 24 hr tablet Take 100 mg by mouth daily. Take with or immediately following a meal.   Yes Historical Provider, MD  torsemide (DEMADEX) 20 MG tablet Take 20 mg by mouth daily.   Yes Historical Provider, MD  BYSTOLIC 10 MG tablet TAKE 2 TABLETS BY MOUTH ONCE DAILY Patient not taking: Reported on 07/11/2015 12/07/13   Jerline Pain, MD  calcitRIOL (ROCALTROL) 0.25 MCG capsule Take 1 capsule (0.25 mcg total) by mouth daily. Patient not taking: Reported on 07/11/2015 09/01/13   Delfina Redwood, MD  minoxidil (LONITEN) 10 MG tablet Take 0.5 tablets (5 mg total) by mouth daily. Patient not taking: Reported on 07/11/2015 08/25/13   Jerline Pain, MD   BP 202/101 mmHg  Pulse 69  Temp(Src) 97.8 F (36.6 C) (Oral)  Resp 23  SpO2 99% Physical Exam  Constitutional: She is oriented to person, place, and time. She appears well-developed and well-nourished.  HENT:  Head: Normocephalic and atraumatic.  Neck: Normal range of motion.  Cardiovascular: Normal rate and regular rhythm.   Pulmonary/Chest: Effort normal. No stridor. No respiratory distress.  Abdominal: Soft. She exhibits no distension. There  is no tenderness.  Musculoskeletal: She exhibits no edema or tenderness.  Neurological: She is alert and oriented to person, place, and time.  Skin: Skin is warm and dry.  Nursing note and vitals reviewed.   ED Course  Procedures (including critical care time) Labs Review Labs Reviewed  CBC WITH DIFFERENTIAL/PLATELET - Abnormal; Notable for the following:    Hemoglobin 11.2 (*)    HCT 34.6 (*)    All other components within normal limits  COMPREHENSIVE METABOLIC PANEL - Abnormal; Notable for the following:    Chloride 112 (*)     BUN 59 (*)    Creatinine, Ser 3.35 (*)    Alkaline Phosphatase 137 (*)    GFR calc non Af Amer 14 (*)    GFR calc Af Amer 16 (*)    All other components within normal limits  HEPATITIS PANEL, ACUTE    Imaging Review No results found. I have personally reviewed and evaluated these images and lab results as part of my medical decision-making.   EKG Interpretation   Date/Time:  Wednesday July 11 2015 11:19:57 EDT Ventricular Rate:  64 PR Interval:    QRS Duration: 76 QT Interval:  449 QTC Calculation: 464 R Axis:   66 Text Interpretation:  Sinus rhythm Biatrial enlargement Anteroseptal  infarct, age indeterminate No significant change since last tracing  Confirmed by [facility] MD, Corene Cornea [phone]) on 07/11/2015 11:22:15 AM      MDM   Final diagnoses:  Dialysis patient [facility])   Was scheduled for dialysis today at dialysis Center however her home dialysis Center stating that she come here for evaluation see if she needed it. She has no shortness of breath, chest pain, palpitations or other complaints at this time. We'll check CBC BMP and try to contact her home dialysis Center [facility] at [phone]) to see exactly what it is that needs to happen for her to get her dialysis.  1006: I discussed with Gillian Shields at the number above. The patient needs a hepatitis panel within 30 days of her dialysis here. She was planned to check for any need for emergent dialysis and also get a hepatitis panel. For not able to get emergent dialysis today she'll need to reschedule appointment which Gillian Shields will help her do if needed.    No indication for acute emergent dialysis. Hepatitis panel is a send out labs the patient will be discharged to follow-up the results on her own and work on getting dialysis through the dialysis Center in [place].   New Prescriptions: Discharge Medication List as of 07/11/2015  1:50 PM       I have personally and contemperaneously reviewed labs and imaging and used  in my decision making as above.   A medical screening exam was performed and I feel the patient has had an appropriate workup for their chief complaint at this time and likelihood of emergent condition existing is low and thus workup can continue on an outpatient basis.. Their vital signs are stable. They have been counseled on decision, discharge, follow up and which symptoms necessitate immediate return to the emergency department.  They verbally stated understanding and agreement with plan and discharged in stable condition.      Merrily Pew, MD 07/11/15 [phone]

## 2015-07-12 LAB — HEPATITIS PANEL, ACUTE
HCV Ab: 2.3 s/co ratio — ABNORMAL HIGH (ref 0.0–0.9)
Hep A IgM: NEGATIVE
Hep B C IgM: NEGATIVE
Hepatitis B Surface Ag: NEGATIVE

## 2015-07-13 ENCOUNTER — Telehealth (HOSPITAL_BASED_OUTPATIENT_CLINIC_OR_DEPARTMENT_OTHER): Payer: Self-pay

## 2015-11-26 IMAGING — CR DG CHEST 2V
2 series · 2 of 2 positions shown · non-contrast
Comparison: 08/28/2013.

CLINICAL DATA: Congestive heart failure.

EXAM:
CHEST  2 VIEW

[w chest pa]
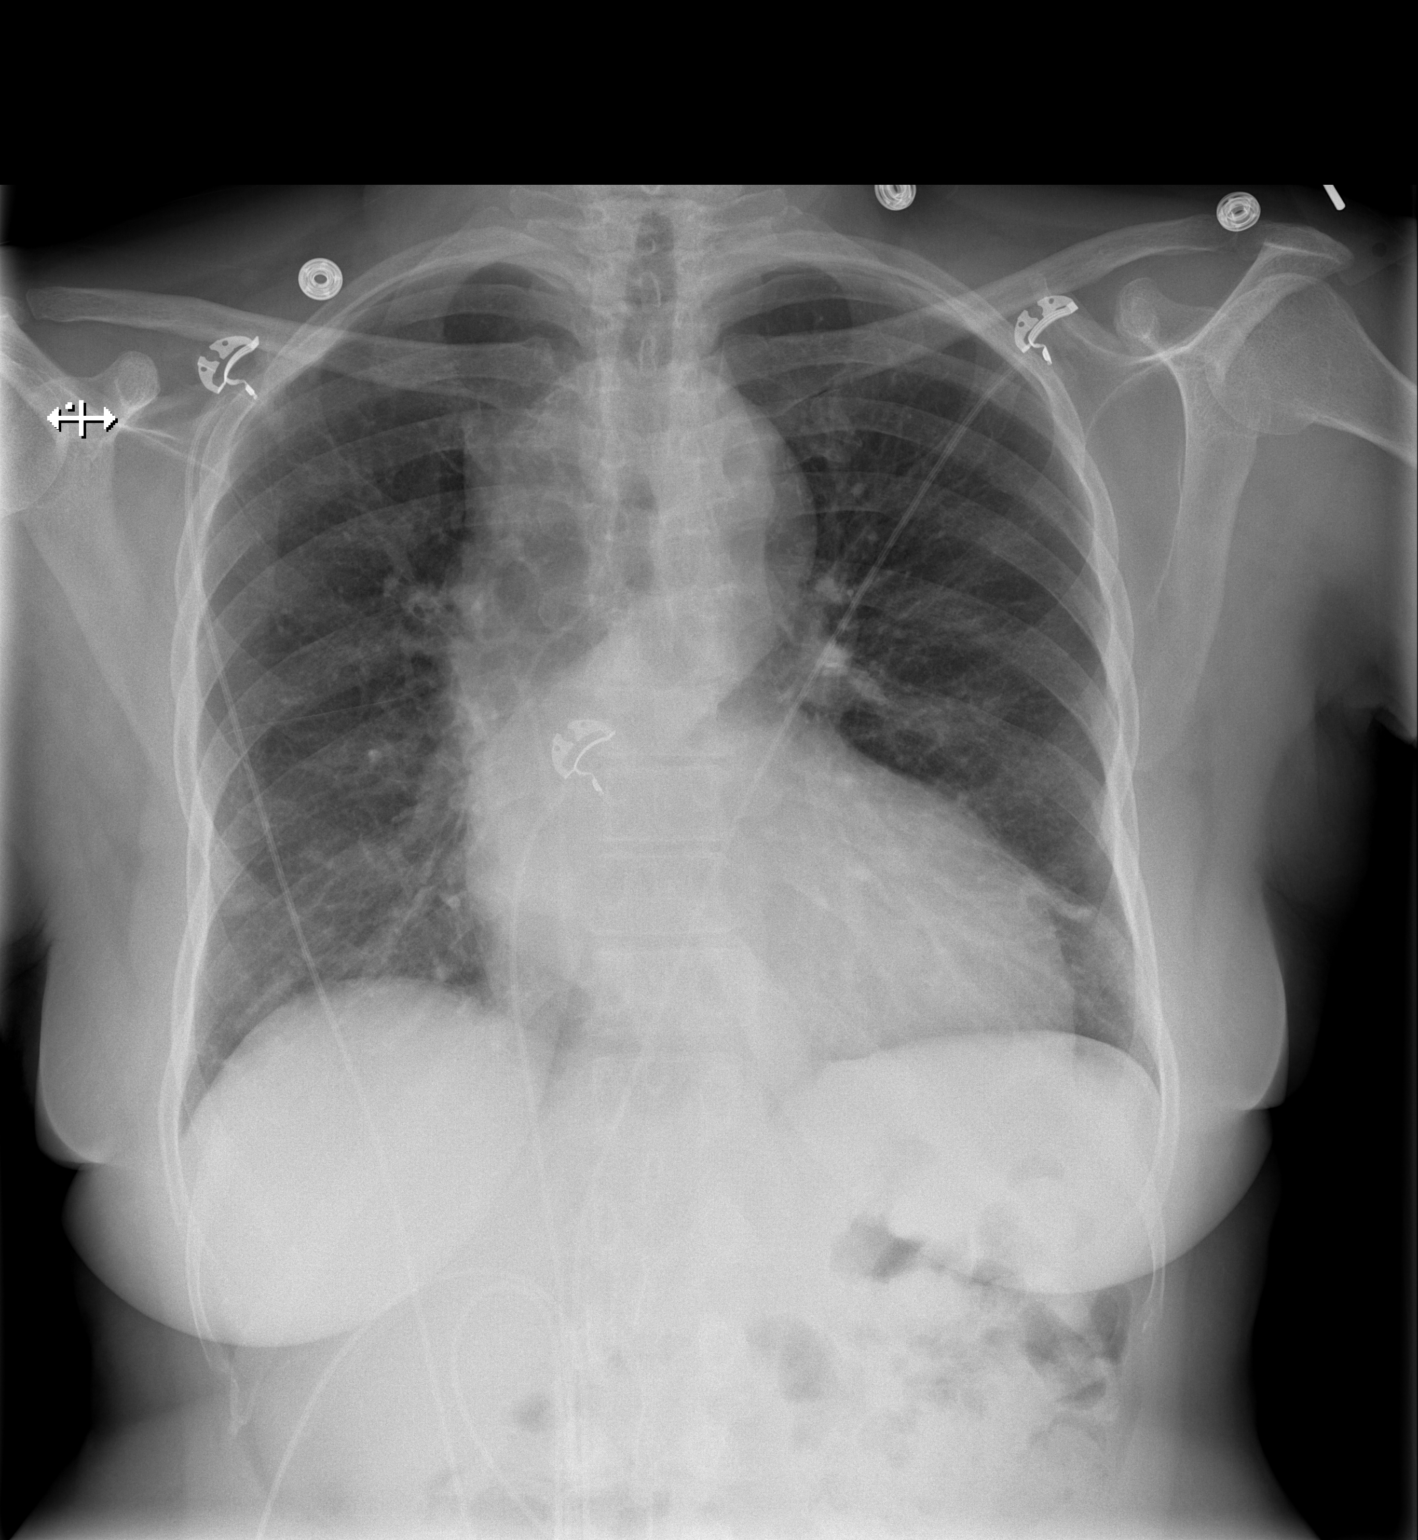

[w chest lat]
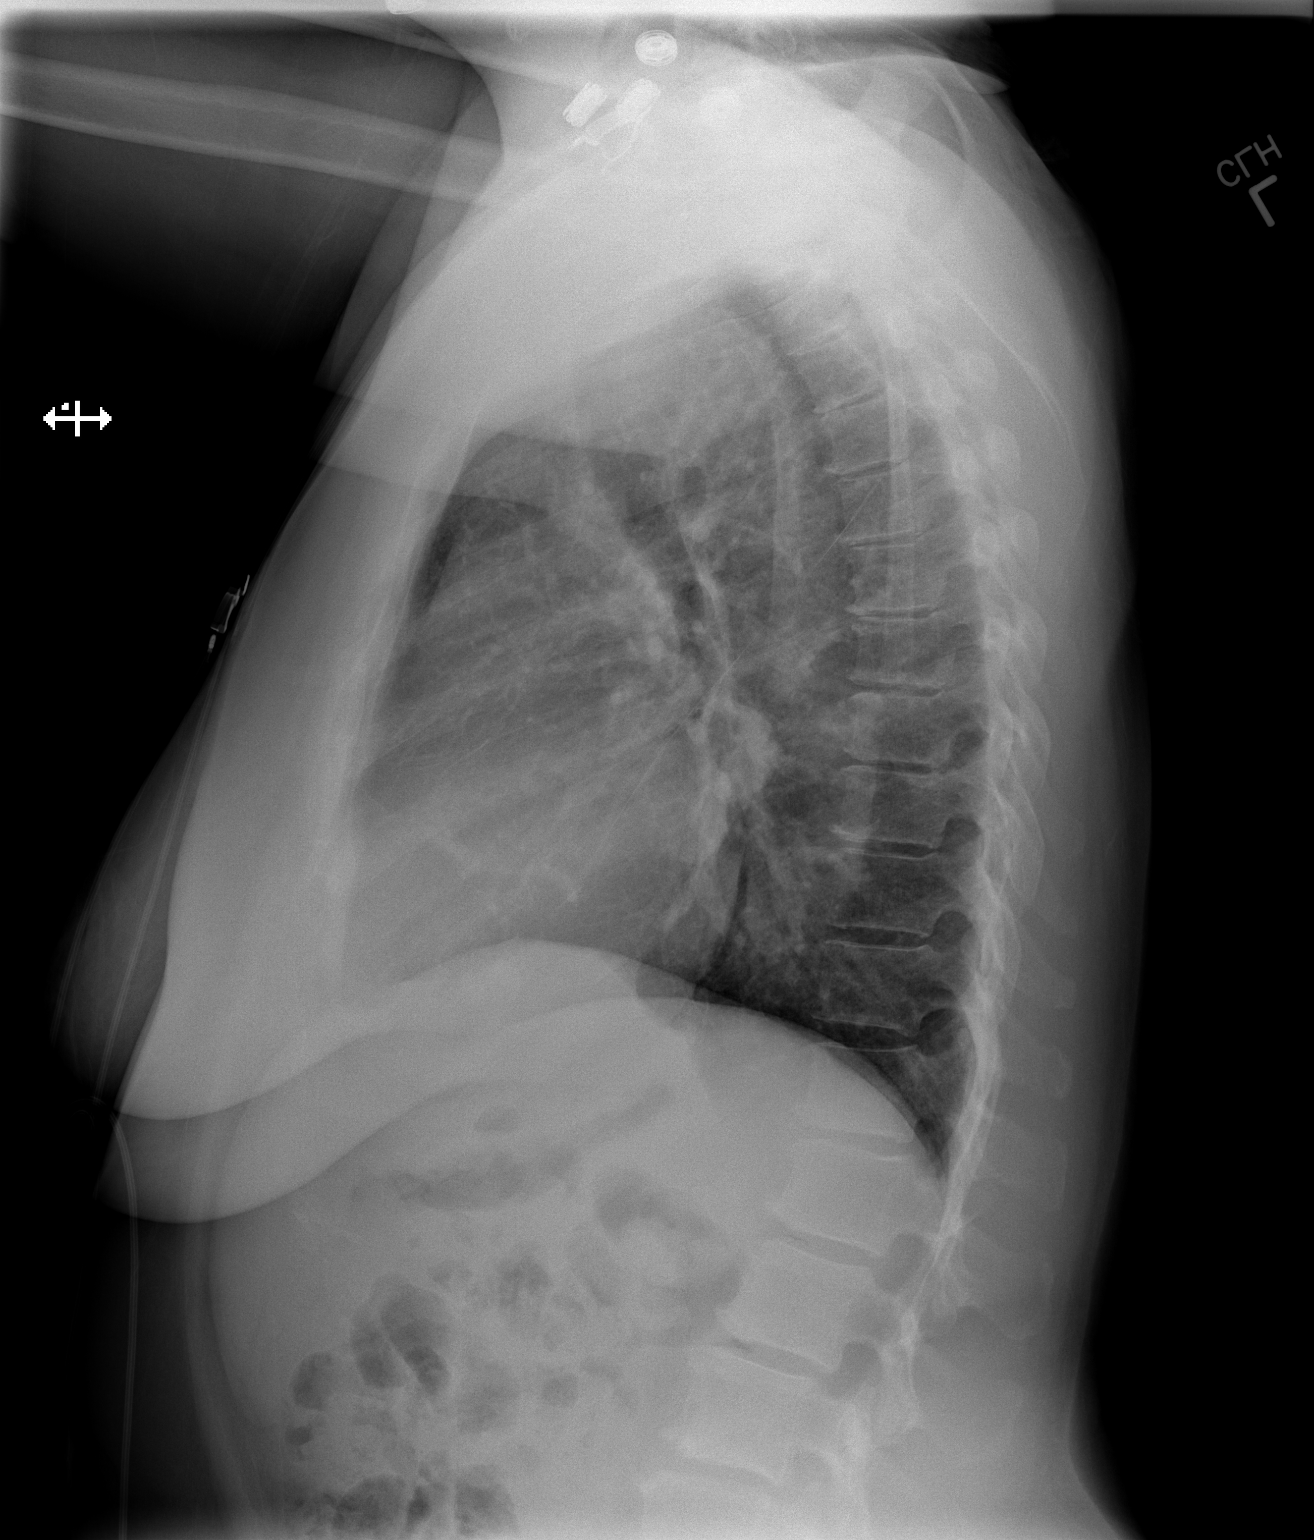

[2 of 2 positions shown; findings below may reference images not displayed]

FINDINGS: Trachea is midline. Heart is enlarged. Thoracic aorta is tortuous.
Subsegmental atelectasis or scarring in the lingula. There may be
subtle bibasilar interstitial prominence and indistinctness. No
pleural fluid.
IMPRESSION: 1. Question trace dependent pulmonary edema.
2. Marked aortic tortuosity.

## 2015-11-27 IMAGING — US US RENAL
1 series · 14 of 25 positions shown · non-contrast
Comparison: 10/21/2011

CLINICAL DATA: Acute on chronic renal failure, history
hypertension, diabetes

EXAM:
RENAL/URINARY TRACT ULTRASOUND COMPLETE

[Series 1: us renal · 0.20mm/px · 14 of 32 slices shown]
[im 1/32]
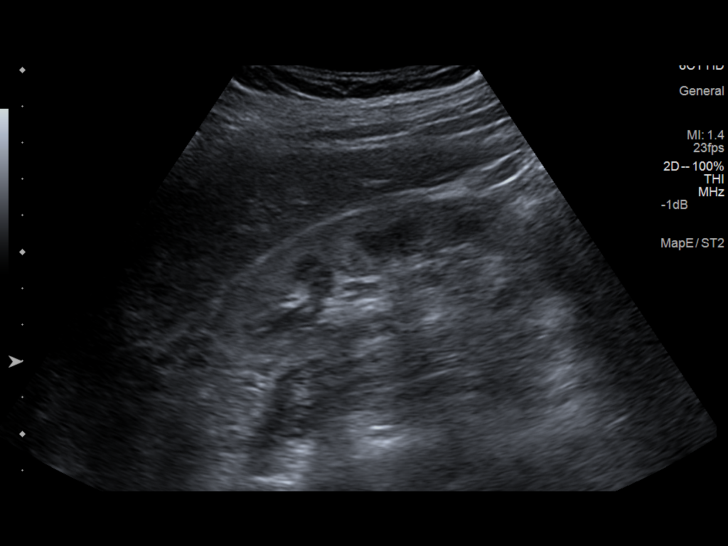
[im 3/32]
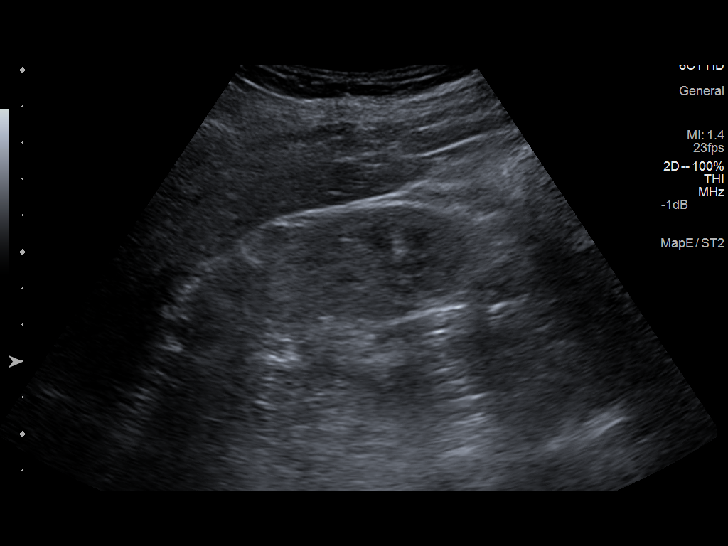
[im 6/32]
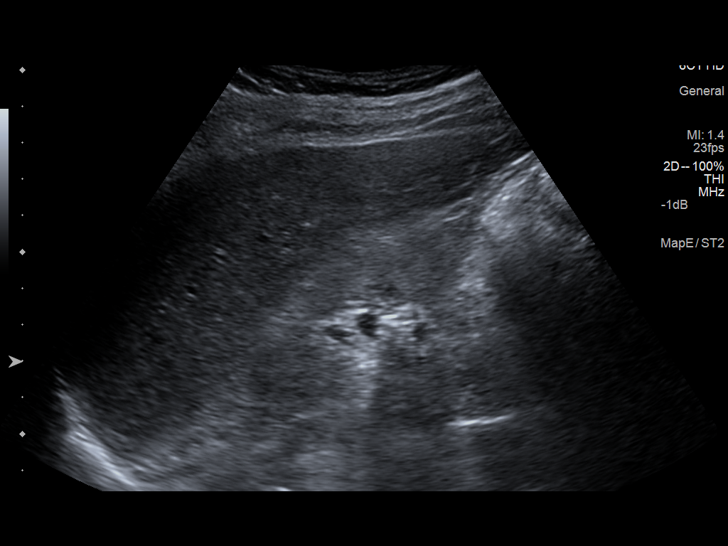
[im 8/32]
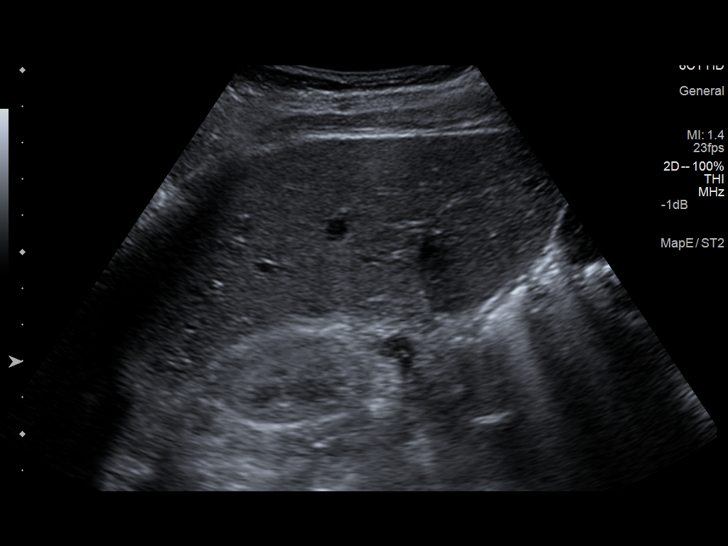
[im 11/32]
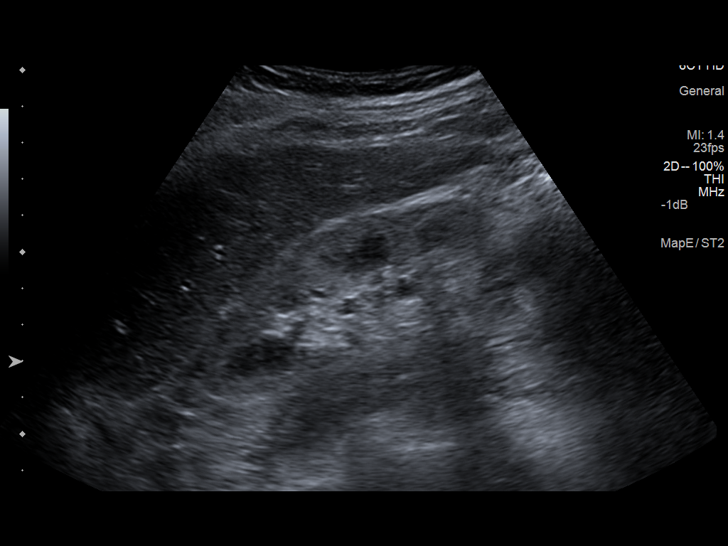
[im 12/32]
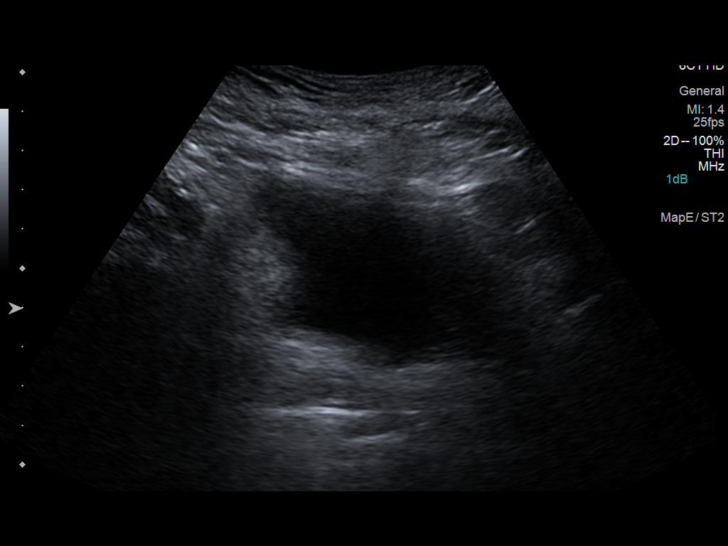
[im 15/32]
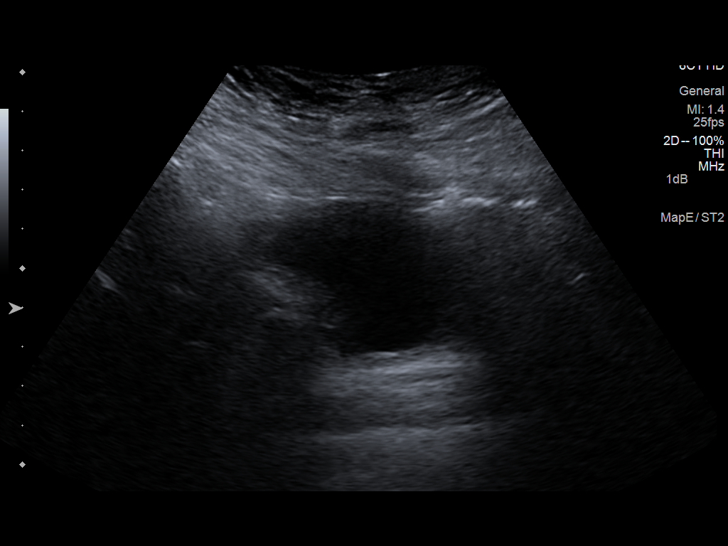
[im 17/32]
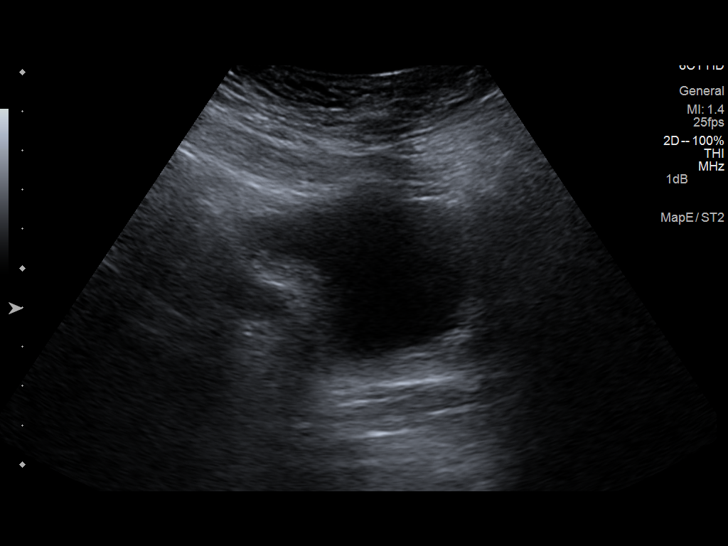
[im 20/32]
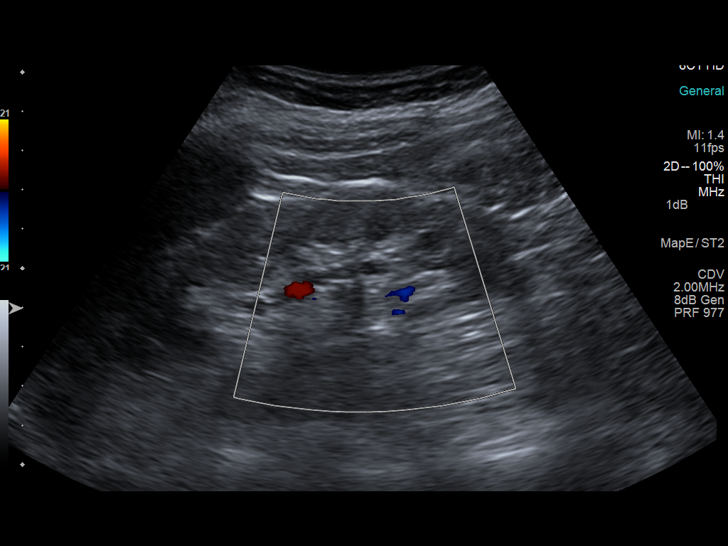
[im 21/32]
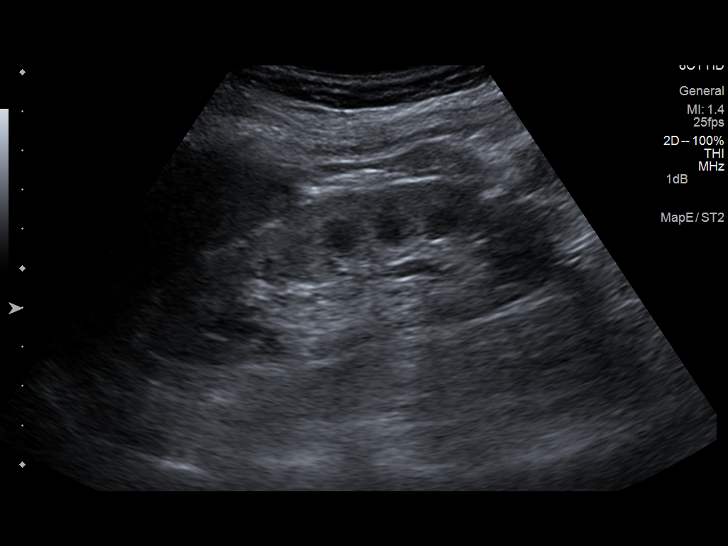
[im 24/32]
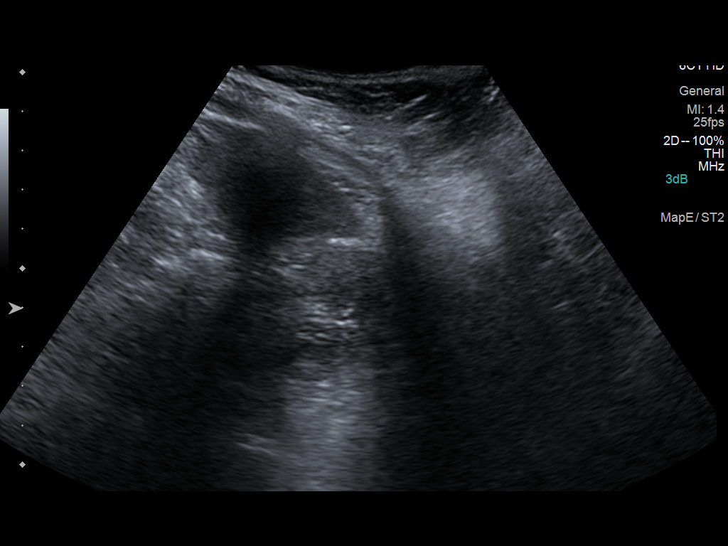
[im 26/32]
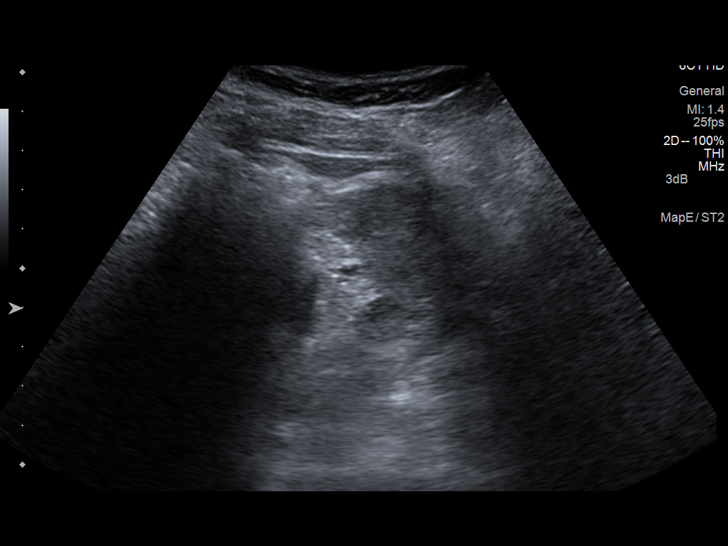
[im 29/32]
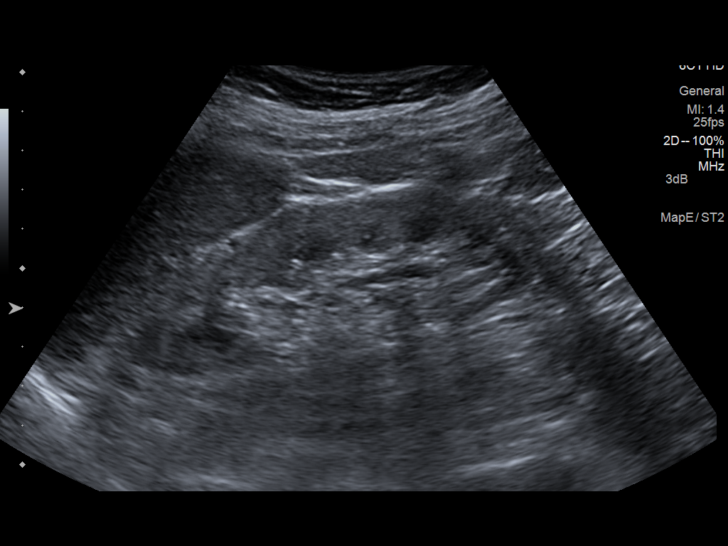
[im 32/32]
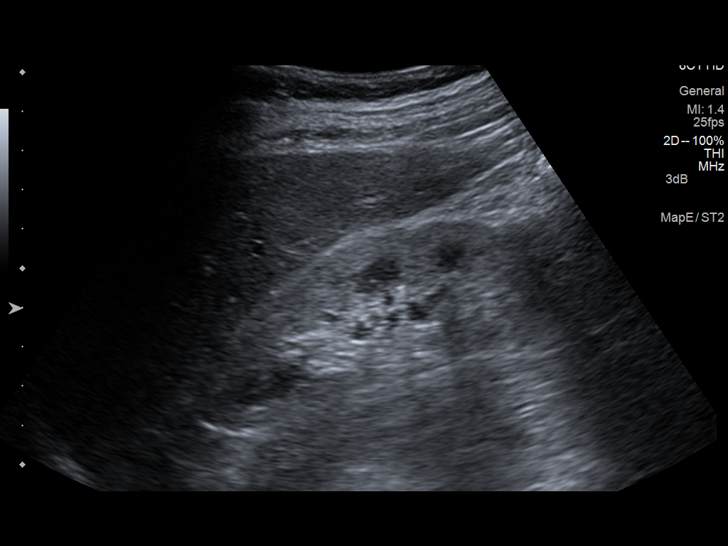

[14 of 25 positions shown; findings below may reference images not displayed]

FINDINGS: Right Kidney:

Length: 10.3 cm. Normal cortical thickness. Increased cortical
echogenicity. No mass, hydronephrosis or shadowing calcification. No
perinephric fluid.

Left Kidney:

Length: 11.0 cm. Normal cortical thickness. Increased cortical
echogenicity. No mass, hydronephrosis or shadowing calcification. No
perinephric fluid.

Bladder:

Partially distended, grossly unremarkable.
IMPRESSION: Medical renal disease changes of both kidneys without evidence of
renal mass or hydronephrosis.
